# Patient Record
Sex: Male | Born: 1965 | Race: White | Hispanic: No | Marital: Married | Smoking: Former smoker
Health system: Southern US, Community
[De-identification: ages and names within clinical notes are randomized; demographics above are authoritative.]

## PROBLEM LIST (undated history)

## (undated) DIAGNOSIS — R0600 Dyspnea, unspecified: Secondary | ICD-10-CM

## (undated) DIAGNOSIS — R062 Wheezing: Secondary | ICD-10-CM

## (undated) DIAGNOSIS — E785 Hyperlipidemia, unspecified: Secondary | ICD-10-CM

## (undated) DIAGNOSIS — N400 Enlarged prostate without lower urinary tract symptoms: Secondary | ICD-10-CM

## (undated) DIAGNOSIS — Z1211 Encounter for screening for malignant neoplasm of colon: Secondary | ICD-10-CM

## (undated) DIAGNOSIS — I10 Essential (primary) hypertension: Secondary | ICD-10-CM

## (undated) DIAGNOSIS — R12 Heartburn: Secondary | ICD-10-CM

## (undated) DIAGNOSIS — J449 Chronic obstructive pulmonary disease, unspecified: Secondary | ICD-10-CM

## (undated) HISTORY — PX: FRACTURE SURGERY: SHX138

## (undated) HISTORY — PX: APPENDECTOMY: SHX54

## (undated) HISTORY — DX: Encounter for screening for malignant neoplasm of colon: Z12.11

## (undated) HISTORY — PX: HEMORRHOID SURGERY: SHX153

---

## 2008-10-25 ENCOUNTER — Ambulatory Visit: Payer: Self-pay | Admitting: Internal Medicine

## 2011-08-31 DIAGNOSIS — I10 Essential (primary) hypertension: Secondary | ICD-10-CM | POA: Insufficient documentation

## 2012-10-31 ENCOUNTER — Ambulatory Visit: Payer: Self-pay | Admitting: Internal Medicine

## 2012-10-31 LAB — DOT URINE DIP
Blood: NEGATIVE
Protein: NEGATIVE
Specific Gravity: 1.01 (ref 1.003–1.030)

## 2014-09-30 ENCOUNTER — Ambulatory Visit: Payer: Self-pay | Admitting: Internal Medicine

## 2014-10-22 ENCOUNTER — Ambulatory Visit: Payer: Self-pay | Admitting: Physician Assistant

## 2014-10-22 LAB — DOT URINE DIP
Blood: NEGATIVE
GLUCOSE, UR: NEGATIVE
Protein: NEGATIVE
Specific Gravity: 1.02 (ref 1.000–1.030)

## 2015-09-23 ENCOUNTER — Encounter: Payer: Self-pay | Admitting: Internal Medicine

## 2015-09-23 DIAGNOSIS — E785 Hyperlipidemia, unspecified: Secondary | ICD-10-CM | POA: Insufficient documentation

## 2015-09-23 DIAGNOSIS — N400 Enlarged prostate without lower urinary tract symptoms: Secondary | ICD-10-CM | POA: Insufficient documentation

## 2015-09-23 DIAGNOSIS — E782 Mixed hyperlipidemia: Secondary | ICD-10-CM | POA: Insufficient documentation

## 2015-09-23 DIAGNOSIS — Z8249 Family history of ischemic heart disease and other diseases of the circulatory system: Secondary | ICD-10-CM | POA: Insufficient documentation

## 2015-09-23 DIAGNOSIS — F17201 Nicotine dependence, unspecified, in remission: Secondary | ICD-10-CM | POA: Insufficient documentation

## 2015-09-30 ENCOUNTER — Ambulatory Visit (INDEPENDENT_AMBULATORY_CARE_PROVIDER_SITE_OTHER): Payer: BLUE CROSS/BLUE SHIELD | Admitting: Internal Medicine

## 2015-09-30 ENCOUNTER — Encounter: Payer: Self-pay | Admitting: Internal Medicine

## 2015-09-30 VITALS — BP 122/62 | HR 80 | Ht 72.0 in | Wt 202.0 lb

## 2015-09-30 DIAGNOSIS — E785 Hyperlipidemia, unspecified: Secondary | ICD-10-CM

## 2015-09-30 DIAGNOSIS — Z0000 Encounter for general adult medical examination without abnormal findings: Secondary | ICD-10-CM | POA: Diagnosis not present

## 2015-09-30 DIAGNOSIS — F17201 Nicotine dependence, unspecified, in remission: Secondary | ICD-10-CM

## 2015-09-30 DIAGNOSIS — Z23 Encounter for immunization: Secondary | ICD-10-CM

## 2015-09-30 DIAGNOSIS — N400 Enlarged prostate without lower urinary tract symptoms: Secondary | ICD-10-CM | POA: Diagnosis not present

## 2015-09-30 DIAGNOSIS — Z720 Tobacco use: Secondary | ICD-10-CM | POA: Diagnosis not present

## 2015-09-30 NOTE — Progress Notes (Signed)
Date:  09/30/2015   Name:  TAYDON NASWORTHY   DOB:  1966-09-08   MRN:  [id number]   Chief Complaint: Annual Exam JAMAL PAVON is a 49 y.o. male who presents today for his Complete Annual Exam. He feels well. He reports exercising a few times per week. He reports he is sleeping well. 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 He remains off cigarettes and is very happy with his excellent health. He's made dietary changes as well as lost a few pounds. He may be due for a tetanus shot - he does not recall his last one. BPH patient's symptoms are stable. He has mild urinary hesitancy but no other issues. Nocturia 1-2. Tried tamsulosin in the past but it caused erectile dysfunction and lightheadedness. The medication did not provide much benefit in any case.   Review of Systems  Constitutional: Negative for fever, diaphoresis, fatigue and unexpected weight change.  HENT: Negative for congestion, hearing loss, sinus pressure, trouble swallowing and voice change.   Eyes: Positive for visual disturbance (requiring reading glasses).  Respiratory: Negative for cough, chest tightness and shortness of breath.   Cardiovascular: Negative for chest pain, palpitations and leg swelling.  Gastrointestinal: Negative for abdominal pain, constipation and blood in stool.  Endocrine: Negative for polydipsia and polyuria.  Genitourinary: Positive for difficulty urinating. Negative for urgency, frequency and hematuria.  Musculoskeletal: Positive for joint swelling (dupytren's contractures bilaterally). Negative for back pain, arthralgias and gait problem.  Skin: Negative for color change and rash.  Allergic/Immunologic: Negative for environmental allergies.  Neurological: Negative for light-headedness, numbness and headaches.  Hematological: Negative for adenopathy.  Psychiatric/Behavioral: Negative for sleep disturbance and dysphoric mood.    Patient Active Problem List   Diagnosis Date Noted  .  BPH (benign prostatic hyperplasia) 09/23/2015  . 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 Hyperlipidemia 09/23/2015  . Tobacco use disorder, mild, in sustained remission 09/23/2015  . BP (high blood pressure) 08/31/2011    Prior to Admission medications   Not on File    No Known Allergies  Past Surgical History  Procedure Laterality Date  . Appendectomy    . Hemorrhoid surgery      Social History  Substance Use Topics  . Smoking status: Former Research scientist (life sciences)  . Smokeless tobacco: None  . Alcohol Use: No     Medication list has been reviewed and updated.   Physical Exam  Constitutional: He is oriented to person, place, and time. He appears well-developed and well-nourished. No distress.  HENT:  Head: Normocephalic and atraumatic.  Eyes: Conjunctivae are normal. Pupils are equal, round, and reactive to light. Right eye exhibits no discharge. Left eye exhibits no discharge. No scleral icterus.  Neck: Normal range of motion. Neck supple. No thyromegaly present.  Cardiovascular: Normal rate, regular rhythm and normal heart sounds.   Pulmonary/Chest: Effort normal and breath sounds normal. No respiratory distress. He has no wheezes. Right breast exhibits no mass. Left breast exhibits no mass.  Abdominal: Soft. Bowel sounds are normal. There is no tenderness. There is no rebound, no guarding and no CVA tenderness.  Musculoskeletal: Normal range of motion. He exhibits no edema or tenderness.       Right hip: Normal.       Left hip: Normal.       Right knee: Normal.       Left knee: Normal.  Neurological: He is alert and oriented to person, place, and time. He has normal reflexes.  Skin: Skin  is warm and dry. No rash noted.  Psychiatric: He has a normal mood and affect. His speech is normal and behavior is normal. Thought content normal.  Nursing note and vitals reviewed.   BP 122/62 mmHg  Pulse 80  Ht 6' (1.829 m)  Wt 202 lb (91.627 kg)  BMI 27.39 kg/m2  Assessment and Plan: 1.  Annual physical exam - POCT urinalysis dipstick - CBC with Differential/Platelet - Comprehensive metabolic panel - TSH  2. Tobacco use disorder, mild, in sustained remission  3. BPH (benign prostatic hyperplasia) Stable mild symptoms; remain off medication for now - PSA  4. Hyperlipidemia Check labs and advise - Lipid panel  5. Need for diphtheria-tetanus-pertussis (Tdap) vaccine - Tdap vaccine greater than or equal to 7yo IM  6. Flu vaccine need - Flu Vaccine QUAD 36+ mos PF IM (Fluarix & Fluzone Quad PF)   Halina Maidens, MD [facility]  09/30/2015

## 2015-09-30 NOTE — Patient Instructions (Signed)
Tdap Vaccine (Tetanus, Diphtheria and Pertussis): What You Need to Know 1. Why get vaccinated? Tetanus, diphtheria and pertussis are very serious diseases. Tdap vaccine can protect us from these diseases. And, Tdap vaccine given to pregnant women can protect newborn babies against pertussis. TETANUS (Lockjaw) is rare in the [place] today. It causes painful muscle tightening and stiffness, usually all over the body.  It can lead to tightening of muscles in the head and neck so you can't open your mouth, swallow, or sometimes even breathe. Tetanus kills about 1 out of 10 people who are infected even after receiving the best medical care. DIPHTHERIA is also rare in the [place] today. It can cause a thick coating to form in the back of the throat.  It can lead to breathing problems, heart failure, paralysis, and death. PERTUSSIS (Whooping Cough) causes severe coughing spells, which can cause difficulty breathing, vomiting and disturbed sleep.  It can also lead to weight loss, incontinence, and rib fractures. Up to 2 in 100 adolescents and 5 in 100 adults with pertussis are hospitalized or have complications, which could include pneumonia or death. These diseases are caused by bacteria. Diphtheria and pertussis are spread from person to person through secretions from coughing or sneezing. Tetanus enters the body through cuts, scratches, or wounds. Before vaccines, as many as 200,000 cases of diphtheria, 200,000 cases of pertussis, and hundreds of cases of tetanus, were reported in the [place] each year. Since vaccination began, reports of cases for tetanus and diphtheria have dropped by about 99% and for pertussis by about 80%. 2. Tdap vaccine Tdap vaccine can protect adolescents and adults from tetanus, diphtheria, and pertussis. One dose of Tdap is routinely given at age 11 or 12. People who did not get Tdap at that age should get it as soon as possible. Tdap is especially important  for healthcare professionals and anyone having close contact with a baby younger than 12 months. Pregnant women should get a dose of Tdap during every pregnancy, to protect the newborn from pertussis. Infants are most at risk for severe, life-threatening complications from pertussis. Another vaccine, called Td, protects against tetanus and diphtheria, but not pertussis. A Td booster should be given every 10 years. Tdap may be given as one of these boosters if you have never gotten Tdap before. Tdap may also be given after a severe cut or burn to prevent tetanus infection. Your doctor or the person giving you the vaccine can give you more information. Tdap may safely be given at the same time as other vaccines. 3. Some people should not get this vaccine  A person who has ever had a life-threatening allergic reaction after a previous dose of any diphtheria, tetanus or pertussis containing vaccine, OR has a severe allergy to any part of this vaccine, should not get Tdap vaccine. Tell the person giving the vaccine about any severe allergies.  Anyone who had coma or long repeated seizures within 7 days after a childhood dose of DTP or DTaP, or a previous dose of Tdap, should not get Tdap, unless a cause other than the vaccine was found. They can still get Td.  Talk to your doctor if you:  have seizures or another nervous system problem,  had severe pain or swelling after any vaccine containing diphtheria, tetanus or pertussis,  ever had a condition called Guillain-Barr Syndrome (GBS),  aren't feeling well on the day the shot is scheduled. 4. Risks With any medicine, including vaccines, there is   a chance of side effects. These are usually mild and go away on their own. Serious reactions are also possible but are rare. Most people who get Tdap vaccine do not have any problems with it. Mild problems following Tdap (Did not interfere with activities)  Pain where the shot was given (about 3 in 4  adolescents or 2 in 3 adults)  Redness or swelling where the shot was given (about 1 person in 5)  Mild fever of at least 100.4F (up to about 1 in 25 adolescents or 1 in 100 adults)  Headache (about 3 or 4 people in 10)  Tiredness (about 1 person in 3 or 4)  Nausea, vomiting, diarrhea, stomach ache (up to 1 in 4 adolescents or 1 in 10 adults)  Chills, sore joints (about 1 person in 10)  Body aches (about 1 person in 3 or 4)  Rash, swollen glands (uncommon) Moderate problems following Tdap (Interfered with activities, but did not require medical attention)  Pain where the shot was given (up to 1 in 5 or 6)  Redness or swelling where the shot was given (up to about 1 in 16 adolescents or 1 in 12 adults)  Fever over 102F (about 1 in 100 adolescents or 1 in 250 adults)  Headache (about 1 in 7 adolescents or 1 in 10 adults)  Nausea, vomiting, diarrhea, stomach ache (up to 1 or 3 people in 100)  Swelling of the entire arm where the shot was given (up to about 1 in 500). Severe problems following Tdap (Unable to perform usual activities; required medical attention)  Swelling, severe pain, bleeding and redness in the arm where the shot was given (rare). Problems that could happen after any vaccine:  People sometimes faint after a medical procedure, including vaccination. Sitting or lying down for about 15 minutes can help prevent fainting, and injuries caused by a fall. Tell your doctor if you feel dizzy, or have vision changes or ringing in the ears.  Some people get severe pain in the shoulder and have difficulty moving the arm where a shot was given. This happens very rarely.  Any medication can cause a severe allergic reaction. Such reactions from a vaccine are very rare, estimated at fewer than 1 in a million doses, and would happen within a few minutes to a few hours after the vaccination. As with any medicine, there is a very remote chance of a vaccine causing a serious  injury or death. The safety of vaccines is always being monitored. For more information, visit: www.cdc.gov/vaccinesafety/ 5. What if there is a serious problem? What should I look for?  Look for anything that concerns you, such as signs of a severe allergic reaction, very high fever, or unusual behavior.  Signs of a severe allergic reaction can include hives, swelling of the face and throat, difficulty breathing, a fast heartbeat, dizziness, and weakness. These would usually start a few minutes to a few hours after the vaccination. What should I do?  If you think it is a severe allergic reaction or other emergency that can't wait, call 9-1-1 or get the person to the nearest hospital. Otherwise, call your doctor.  Afterward, the reaction should be reported to the Vaccine Adverse Event Reporting System (VAERS). Your doctor might file this report, or you can do it yourself through the VAERS web site at www.vaers.hhs.gov, or by calling 1-800-822-7967. VAERS does not give medical advice.  6. The National Vaccine Injury Compensation Program The National Vaccine Injury Compensation Program (  VICP) is a federal program that was created to compensate people who may have been injured by certain vaccines. Persons who believe they may have been injured by a vaccine can learn about the program and about filing a claim by calling 1-800-338-2382 or visiting the VICP website at www.hrsa.gov/vaccinecompensation. There is a time limit to file a claim for compensation. 7. How can I learn more?  Ask your doctor. He or she can give you the vaccine package insert or suggest other sources of information.  Call your local or state health department.  Contact the Centers for Disease Control and Prevention (CDC):  Call 1-800-232-4636 (1-800-CDC-INFO) or  Visit CDC's website at www.cdc.gov/vaccines CDC Tdap Vaccine VIS (02/09/14)   This information is not intended to replace advice given to you by your health care  Lawrence Vega. Make sure you discuss any questions you have with your health care Lawrence Vega.   Document Released: 06/03/2012 Document Revised: 12/24/2014 Document Reviewed: 03/17/2014 Elsevier Interactive Patient Education 2016 Elsevier Inc.  

## 2015-10-01 LAB — CBC WITH DIFFERENTIAL/PLATELET
BASOS: 0 %
Basophils Absolute: 0 10*3/uL (ref 0.0–0.2)
EOS (ABSOLUTE): 0.2 10*3/uL (ref 0.0–0.4)
EOS: 2 %
HEMATOCRIT: 45.1 % (ref 37.5–51.0)
Hemoglobin: 15.2 g/dL (ref 12.6–17.7)
Immature Grans (Abs): 0 10*3/uL (ref 0.0–0.1)
Immature Granulocytes: 0 %
LYMPHS ABS: 2.2 10*3/uL (ref 0.7–3.1)
Lymphs: 29 %
MCH: 28.1 pg (ref 26.6–33.0)
MCHC: 33.7 g/dL (ref 31.5–35.7)
MCV: 83 fL (ref 79–97)
Monocytes Absolute: 0.4 10*3/uL (ref 0.1–0.9)
Monocytes: 5 %
Neutrophils Absolute: 4.7 10*3/uL (ref 1.4–7.0)
Neutrophils: 64 %
Platelets: 206 10*3/uL (ref 150–379)
RBC: 5.41 x10E6/uL (ref 4.14–5.80)
RDW: 13.4 % (ref 12.3–15.4)
WBC: 7.5 10*3/uL (ref 3.4–10.8)

## 2015-10-01 LAB — COMPREHENSIVE METABOLIC PANEL
A/G RATIO: 1.8 (ref 1.1–2.5)
ALK PHOS: 95 IU/L (ref 39–117)
ALT: 28 IU/L (ref 0–44)
AST: 23 IU/L (ref 0–40)
Albumin: 4.7 g/dL (ref 3.5–5.5)
BILIRUBIN TOTAL: 0.7 mg/dL (ref 0.0–1.2)
BUN/Creatinine Ratio: 11 (ref 9–20)
BUN: 12 mg/dL (ref 6–24)
CO2: 24 mmol/L (ref 18–29)
Calcium: 9.7 mg/dL (ref 8.7–10.2)
Chloride: 96 mmol/L — ABNORMAL LOW (ref 97–108)
Creatinine, Ser: 1.08 mg/dL (ref 0.76–1.27)
GFR calc Af Amer: 94 mL/min/{1.73_m2} (ref >59)
GFR calc non Af Amer: 81 mL/min/{1.73_m2} (ref >59)
GLOBULIN, TOTAL: 2.6 g/dL (ref 1.5–4.5)
Glucose: 89 mg/dL (ref 65–99)
POTASSIUM: 4.1 mmol/L (ref 3.5–5.2)
SODIUM: 137 mmol/L (ref 134–144)
Total Protein: 7.3 g/dL (ref 6.0–8.5)

## 2015-10-01 LAB — TSH: TSH: 0.559 u[IU]/mL (ref 0.450–4.500)

## 2015-10-01 LAB — PSA: Prostate Specific Ag, Serum: 0.5 ng/mL (ref 0.0–4.0)

## 2015-10-01 LAB — LIPID PANEL
CHOLESTEROL TOTAL: 250 mg/dL — AB (ref 100–199)
Chol/HDL Ratio: 5.2 ratio units — ABNORMAL HIGH (ref 0.0–5.0)
HDL: 48 mg/dL (ref >39)
LDL Calculated: 171 mg/dL — ABNORMAL HIGH (ref 0–99)
Triglycerides: 156 mg/dL — ABNORMAL HIGH (ref 0–149)
VLDL Cholesterol Cal: 31 mg/dL (ref 5–40)

## 2016-08-31 ENCOUNTER — Ambulatory Visit (INDEPENDENT_AMBULATORY_CARE_PROVIDER_SITE_OTHER): Payer: BLUE CROSS/BLUE SHIELD | Admitting: Internal Medicine

## 2016-08-31 ENCOUNTER — Encounter: Payer: Self-pay | Admitting: Internal Medicine

## 2016-08-31 VITALS — BP 136/84 | HR 64 | Resp 16 | Ht 72.0 in | Wt 199.0 lb

## 2016-08-31 DIAGNOSIS — L729 Follicular cyst of the skin and subcutaneous tissue, unspecified: Secondary | ICD-10-CM | POA: Diagnosis not present

## 2016-08-31 DIAGNOSIS — IMO0001 Reserved for inherently not codable concepts without codable children: Secondary | ICD-10-CM

## 2016-08-31 DIAGNOSIS — E785 Hyperlipidemia, unspecified: Secondary | ICD-10-CM | POA: Diagnosis not present

## 2016-08-31 DIAGNOSIS — R030 Elevated blood-pressure reading, without diagnosis of hypertension: Secondary | ICD-10-CM | POA: Diagnosis not present

## 2016-08-31 DIAGNOSIS — J40 Bronchitis, not specified as acute or chronic: Secondary | ICD-10-CM

## 2016-08-31 DIAGNOSIS — M67449 Ganglion, unspecified hand: Secondary | ICD-10-CM

## 2016-08-31 DIAGNOSIS — N400 Enlarged prostate without lower urinary tract symptoms: Secondary | ICD-10-CM

## 2016-08-31 DIAGNOSIS — Z0000 Encounter for general adult medical examination without abnormal findings: Secondary | ICD-10-CM

## 2016-08-31 LAB — POCT URINALYSIS DIPSTICK
Bilirubin, UA: NEGATIVE
GLUCOSE UA: NEGATIVE
Ketones, UA: NEGATIVE
LEUKOCYTES UA: NEGATIVE
NITRITE UA: NEGATIVE
PROTEIN UA: NEGATIVE
RBC UA: NEGATIVE
Spec Grav, UA: 1.01
UROBILINOGEN UA: 0.2
pH, UA: 6

## 2016-08-31 MED ORDER — AZITHROMYCIN 250 MG PO TABS: ORAL_TABLET | ORAL | 0 refills | Status: DC

## 2016-08-31 MED ORDER — HYDROCOD POLST-CPM POLST ER 10-8 MG/5ML PO SUER: 5.0000 mL | Freq: Two times a day (BID) | ORAL | 0 refills | Status: DC

## 2016-08-31 NOTE — Progress Notes (Signed)
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 ) Lawrence Vega is a 50 y.o. male who presents today for his Complete Annual Exam. He feels well. He reports exercising walking and has a physical job. He reports he is sleeping well in general - worse recently due to cough. He has a biometric form to be completed for work.  URI   This is a new problem. The current episode started in the past 7 days. There has been no fever. Associated symptoms include coughing (thick green phlegm) and a sore throat. Pertinent negatives include no abdominal pain, chest pain, diarrhea, dysuria, ear pain, headaches, rash or wheezing. He has tried nothing for the symptoms.     Review of Systems  Constitutional: Negative for appetite change, chills, diaphoresis, fatigue and unexpected weight change.  HENT: Positive for sore throat. Negative for ear pain, hearing loss, tinnitus, trouble swallowing and voice change.   Eyes: Negative for visual disturbance.  Respiratory: Positive for cough (thick green phlegm). Negative for choking, shortness of breath and wheezing.   Cardiovascular: Negative for chest pain, palpitations and leg swelling.  Gastrointestinal: Negative for abdominal pain, blood in stool, constipation and diarrhea.  Genitourinary: Negative for difficulty urinating, dysuria and frequency.  Musculoskeletal: Positive for arthralgias and back pain. Negative for myalgias.  Skin: Negative for color change and rash.  Neurological: Negative for dizziness, syncope and headaches.  Hematological: Negative for adenopathy.  Psychiatric/Behavioral: Negative for dysphoric mood and sleep disturbance.    Patient Active Problem List   Diagnosis Date Noted  . BPH (benign prostatic hyperplasia) 09/23/2015  . 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  Hyperlipidemia 09/23/2015  . Tobacco use disorder, mild, in sustained remission 09/23/2015  . BP (high blood pressure) 08/31/2011    Prior to Admission medications   Not on File    No Known Allergies  Past Surgical History:  Procedure Laterality Date  . APPENDECTOMY    . HEMORRHOID SURGERY      Social History  Substance Use Topics  . Smoking status: Former Research scientist (life sciences)  . Smokeless tobacco: Never Used  . Alcohol use No     Medication list has been reviewed and updated.   Physical Exam  Constitutional: He is oriented to person, place, and time. He appears well-developed and well-nourished.  HENT:  Head: Normocephalic.  Right Ear: Tympanic membrane, external ear and ear canal normal.  Left Ear: Tympanic membrane, external ear and ear canal normal.  Nose: Nose normal.  Mouth/Throat: Uvula is midline and oropharynx is clear and moist.  Eyes: Conjunctivae and EOM are normal. Pupils are equal, round, and reactive to light.  Neck: Normal range of motion. Neck supple. Carotid bruit is not present. No thyromegaly present.  Cardiovascular: Normal rate, regular rhythm, normal heart sounds and intact distal pulses.   Pulmonary/Chest: Effort normal and breath sounds normal. He has no wheezes. Right breast exhibits no mass. Left breast exhibits no mass.  Abdominal: Soft. Normal appearance and bowel sounds are normal. There is no hepatosplenomegaly. There is no tenderness.  Genitourinary: Rectum normal and prostate normal. Rectal exam shows no mass and no tenderness.  Musculoskeletal: Normal range of motion.  Mucous cyst of left 5th finger  Lymphadenopathy:    He has no cervical adenopathy.  Neurological: He is alert and oriented  to person, place, and time. He has normal reflexes.  Skin: Skin is warm, dry and intact.  Psychiatric: He has a normal mood and affect. His speech is normal and behavior is normal. Judgment and thought content normal.  Nursing note and vitals reviewed.   BP  136/84 (BP Location: Right Arm, Patient Position: Sitting, Cuff Size: Normal)   Pulse 64   Resp 16   Ht 6' (1.829 m)   Wt 199 lb (90.3 kg)   SpO2 97%   BMI 26.99 kg/m   Assessment and Plan: 1. Annual physical exam Normal exam - POCT urinalysis dipstick  2. Elevated blood pressure Stable - no treatment needed Continue low salt diet and exercise - CBC with Differential/Platelet - Comprehensive metabolic panel  3. BPH (benign prostatic hyperplasia) - PSA  4. Hyperlipidemia - Lipid panel  5. Bronchitis - azithromycin (ZITHROMAX Z-PAK) 250 MG tablet; Take 2 tabs on day #1 then 1 tab daily  Dispense: 6 each; Refill: 0 - chlorpheniramine-HYDROcodone (TUSSIONEX PENNKINETIC ER) 10-8 MG/5ML SUER; Take 5 mLs by mouth 2 (two) times daily.  Dispense: 473 mL; Refill: 0  6. Mucous cyst of finger Refer to Orthopedics Can take tylenol or Advil as needed for joint pain   Halina Maidens, MD [facility]  08/31/2016

## 2016-09-01 LAB — CBC WITH DIFFERENTIAL/PLATELET
BASOS: 0 %
Basophils Absolute: 0 10*3/uL (ref 0.0–0.2)
EOS (ABSOLUTE): 0.2 10*3/uL (ref 0.0–0.4)
EOS: 2 %
HEMOGLOBIN: 16 g/dL (ref 12.6–17.7)
Hematocrit: 46 % (ref 37.5–51.0)
IMMATURE GRANS (ABS): 0 10*3/uL (ref 0.0–0.1)
IMMATURE GRANULOCYTES: 0 %
LYMPHS: 18 %
Lymphocytes Absolute: 1.7 10*3/uL (ref 0.7–3.1)
MCH: 29.4 pg (ref 26.6–33.0)
MCHC: 34.8 g/dL (ref 31.5–35.7)
MCV: 84 fL (ref 79–97)
MONOCYTES: 8 %
Monocytes Absolute: 0.8 10*3/uL (ref 0.1–0.9)
NEUTROS ABS: 6.7 10*3/uL (ref 1.4–7.0)
NEUTROS PCT: 72 %
Platelets: 206 10*3/uL (ref 150–379)
RBC: 5.45 x10E6/uL (ref 4.14–5.80)
RDW: 14.1 % (ref 12.3–15.4)
WBC: 9.3 10*3/uL (ref 3.4–10.8)

## 2016-09-01 LAB — COMPREHENSIVE METABOLIC PANEL
A/G RATIO: 1.6 (ref 1.2–2.2)
ALBUMIN: 4.5 g/dL (ref 3.5–5.5)
ALT: 18 IU/L (ref 0–44)
AST: 18 IU/L (ref 0–40)
Alkaline Phosphatase: 92 IU/L (ref 39–117)
BILIRUBIN TOTAL: 0.9 mg/dL (ref 0.0–1.2)
BUN / CREAT RATIO: 11 (ref 9–20)
BUN: 11 mg/dL (ref 6–24)
CALCIUM: 9.3 mg/dL (ref 8.7–10.2)
CHLORIDE: 100 mmol/L (ref 96–106)
CO2: 22 mmol/L (ref 18–29)
Creatinine, Ser: 0.98 mg/dL (ref 0.76–1.27)
GFR, EST AFRICAN AMERICAN: 105 mL/min/{1.73_m2} (ref >59)
GFR, EST NON AFRICAN AMERICAN: 91 mL/min/{1.73_m2} (ref >59)
Globulin, Total: 2.8 g/dL (ref 1.5–4.5)
Glucose: 77 mg/dL (ref 65–99)
POTASSIUM: 4.2 mmol/L (ref 3.5–5.2)
Sodium: 140 mmol/L (ref 134–144)
TOTAL PROTEIN: 7.3 g/dL (ref 6.0–8.5)

## 2016-09-01 LAB — LIPID PANEL
CHOL/HDL RATIO: 4.9 ratio (ref 0.0–5.0)
Cholesterol, Total: 221 mg/dL — ABNORMAL HIGH (ref 100–199)
HDL: 45 mg/dL (ref >39)
LDL Calculated: 143 mg/dL — ABNORMAL HIGH (ref 0–99)
Triglycerides: 165 mg/dL — ABNORMAL HIGH (ref 0–149)
VLDL CHOLESTEROL CAL: 33 mg/dL (ref 5–40)

## 2016-09-01 LAB — PSA: PROSTATE SPECIFIC AG, SERUM: 0.4 ng/mL (ref 0.0–4.0)

## 2016-09-24 ENCOUNTER — Telehealth: Payer: Self-pay

## 2016-09-24 ENCOUNTER — Other Ambulatory Visit: Payer: Self-pay | Admitting: Internal Medicine

## 2016-09-24 DIAGNOSIS — J40 Bronchitis, not specified as acute or chronic: Secondary | ICD-10-CM

## 2016-09-24 MED ORDER — AZITHROMYCIN 250 MG PO TABS: ORAL_TABLET | ORAL | 0 refills | Status: DC

## 2016-09-24 NOTE — Telephone Encounter (Signed)
Left VM requesting ZPak again. Was seen for CPE 4 weeks ago and having more [facility] and wants to do one more round of Abx. Please let me know what to tell him if he needs OV. 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

## 2016-09-24 NOTE — Telephone Encounter (Signed)
Sent to [employer].  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

## 2016-09-28 ENCOUNTER — Ambulatory Visit
Admission: EM | Admit: 2016-09-28 | Discharge: 2016-09-28 | Disposition: A | Discharge status: Home / Self Care | Payer: Self-pay | Attending: Family Medicine | Admitting: Family Medicine

## 2016-09-28 ENCOUNTER — Encounter: Payer: Self-pay | Admitting: *Deleted

## 2016-09-28 DIAGNOSIS — Z0289 Encounter for other administrative examinations: Secondary | ICD-10-CM

## 2016-09-28 LAB — DEPT OF TRANSP DIPSTICK, URINE (ARMC ONLY)
Glucose, UA: NEGATIVE mg/dL
HGB URINE DIPSTICK: NEGATIVE
PROTEIN: NEGATIVE mg/dL
Specific Gravity, Urine: 1.02 (ref 1.005–1.030)

## 2016-09-28 NOTE — ED Provider Notes (Signed)
[facility]    CSN: [id number] Arrival date & time: 09/28/16  0834     History   Chief Complaint Chief Complaint  Patient presents with  . Commercial Driver's License Exam    HPI Lawrence Vega is a 50 y.o. male.   Patient here for DOT Physical (see scanned form)   The history is provided by the patient.    History reviewed. No pertinent past medical history.  Patient Active Problem List   Diagnosis Date Noted  . BPH (benign prostatic hyperplasia) 09/23/2015  . 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 Hyperlipidemia 09/23/2015  . Tobacco use disorder, mild, in sustained remission 09/23/2015  . BP (high blood pressure) 08/31/2011    Past Surgical History:  Procedure Laterality Date  . APPENDECTOMY    . FRACTURE SURGERY    . HEMORRHOID SURGERY         Home Medications    Prior to Admission medications   Medication Sig Start Date End Date Taking? Authorizing Provider  azithromycin (ZITHROMAX Z-PAK) 250 MG tablet Take 2 tabs on day #1 then 1 tab daily 09/24/16  Yes Glean Hess, MD  chlorpheniramine-HYDROcodone [facility] PENNKINETIC ER) 10-8 MG/5ML SUER Take 5 mLs by mouth 2 (two) times daily. 08/31/16   Glean Hess, MD    Family History Family History  Problem Relation Age of Onset  . Hypertension Father   . CAD Father   . AAA (abdominal aortic aneurysm) Father     Social History Social History  Substance Use Topics  . Smoking status: Former Research scientist (life sciences)  . Smokeless tobacco: Never Used  . Alcohol use No     Allergies   Review of patient's allergies indicates no known allergies.   Review of Systems Review of Systems   Physical Exam Triage Vital Signs ED Triage Vitals  Enc Vitals Group     BP --      Pulse Rate 09/28/16 0901 65     Resp 09/28/16 0901 18     Temp 09/28/16 0901 98.2 F (36.8 C)     Temp Source 09/28/16 0901 Oral     SpO2 09/28/16 0901 99 %     Weight 09/28/16 0904 203 lb (92.1 kg)     Height 09/28/16  0904 6' (1.829 m)     Head Circumference --      Peak Flow --      Pain Score --      Pain Loc --      Pain Edu? --      Excl. in [place]? --    No data found.   Updated Vital Signs Pulse 65   Temp 98.2 F (36.8 C) (Oral)   Resp 18   Ht 6' (1.829 m)   Wt 203 lb (92.1 kg)   SpO2 99%   BMI 27.53 kg/m   Visual Acuity Right Eye Distance:   Left Eye Distance:   Bilateral Distance:    Right Eye Near:   Left Eye Near:    Bilateral Near:     Physical Exam   UC Treatments / Results  Labs (all labs ordered are listed, but only abnormal results are displayed) Labs Reviewed  DEPT OF TRANSP DIPSTICK, URINE([facility] ONLY)    EKG  EKG Interpretation None       Radiology No results found.  Procedures Procedures (including critical care time)  Medications Ordered in UC Medications - No data to display   Initial Impression / Assessment and Plan / UC  Course  I have reviewed the triage vital signs and the nursing notes.  Pertinent labs & imaging results that were available during my care of the patient were reviewed by me and considered in my medical decision making (see chart for details).  Clinical Course      Final Clinical Impressions(s) / UC Diagnoses   Final diagnoses:  Encounter for examination required by Department of Transportation (DOT)    New Prescriptions New Prescriptions   No medications on file   DOT Physical (medically qualified for 2 years; see scanned form)   Norval Gable, MD 09/28/16 [phone]

## 2016-09-28 NOTE — ED Triage Notes (Signed)
Commercial Drivers License physical exam

## 2016-10-05 ENCOUNTER — Encounter: Payer: Self-pay | Admitting: Internal Medicine

## 2017-09-06 ENCOUNTER — Ambulatory Visit
Admission: RE | Admit: 2017-09-06 | Discharge: 2017-09-06 | Disposition: A | Discharge status: Home / Self Care | Payer: BLUE CROSS/BLUE SHIELD | Source: Ambulatory Visit | Attending: Internal Medicine | Admitting: Internal Medicine

## 2017-09-06 ENCOUNTER — Other Ambulatory Visit: Admission: RE | Admit: 2017-09-06 | Payer: BLUE CROSS/BLUE SHIELD | Source: Ambulatory Visit | Admitting: *Deleted

## 2017-09-06 ENCOUNTER — Encounter: Payer: Self-pay | Admitting: Internal Medicine

## 2017-09-06 ENCOUNTER — Ambulatory Visit (INDEPENDENT_AMBULATORY_CARE_PROVIDER_SITE_OTHER): Payer: BLUE CROSS/BLUE SHIELD | Admitting: Internal Medicine

## 2017-09-06 VITALS — BP 142/96 | HR 60 | Ht 72.0 in | Wt 209.0 lb

## 2017-09-06 DIAGNOSIS — Z125 Encounter for screening for malignant neoplasm of prostate: Secondary | ICD-10-CM | POA: Diagnosis not present

## 2017-09-06 DIAGNOSIS — R062 Wheezing: Secondary | ICD-10-CM | POA: Diagnosis not present

## 2017-09-06 DIAGNOSIS — Z0001 Encounter for general adult medical examination with abnormal findings: Secondary | ICD-10-CM

## 2017-09-06 DIAGNOSIS — E782 Mixed hyperlipidemia: Secondary | ICD-10-CM | POA: Diagnosis not present

## 2017-09-06 DIAGNOSIS — I10 Essential (primary) hypertension: Secondary | ICD-10-CM

## 2017-09-06 DIAGNOSIS — Z8249 Family history of ischemic heart disease and other diseases of the circulatory system: Secondary | ICD-10-CM

## 2017-09-06 DIAGNOSIS — N400 Enlarged prostate without lower urinary tract symptoms: Secondary | ICD-10-CM | POA: Diagnosis not present

## 2017-09-06 DIAGNOSIS — F17201 Nicotine dependence, unspecified, in remission: Secondary | ICD-10-CM

## 2017-09-06 DIAGNOSIS — Z0000 Encounter for general adult medical examination without abnormal findings: Secondary | ICD-10-CM

## 2017-09-06 LAB — POCT URINALYSIS DIPSTICK
Bilirubin, UA: NEGATIVE
GLUCOSE UA: NEGATIVE
Ketones, UA: NEGATIVE
LEUKOCYTES UA: NEGATIVE
NITRITE UA: NEGATIVE
Protein, UA: NEGATIVE
RBC UA: NEGATIVE
Spec Grav, UA: 1.015 (ref 1.010–1.025)
UROBILINOGEN UA: 0.2 U/dL
pH, UA: 6 (ref 5.0–8.0)

## 2017-09-06 MED ORDER — ALBUTEROL SULFATE HFA 108 (90 BASE) MCG/ACT IN AERS: 2.0000 | INHALATION_SPRAY | Freq: Four times a day (QID) | RESPIRATORY_TRACT | 0 refills | Status: DC | PRN

## 2017-09-06 MED ORDER — LOSARTAN POTASSIUM 50 MG PO TABS: 50.0000 mg | ORAL_TABLET | Freq: Every day | ORAL | 5 refills | Status: DC

## 2017-09-06 NOTE — Progress Notes (Signed)
Date:  09/06/2017   Name:  Lawrence Vega   DOB:  November 08, 1966   MRN:  [id number]   Chief Complaint: Annual Exam Lawrence Vega is a 51 y.o. male who presents today for his Complete Annual Exam. He feels well. 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 He reports he is sleeping well.   Also needs biometrics for work and insurance.  Wheezing   This is a new problem. The problem occurs intermittently. Pertinent negatives include no abdominal pain, chest pain, chills, diarrhea, headaches, rash or shortness of breath.  Hypertension  The problem has been waxing and waning since onset. The problem is uncontrolled. Pertinent negatives include no chest pain, headaches, palpitations or shortness of breath. There are no associated agents to hypertension. Risk factors for coronary artery disease include family history. Past treatments include nothing.     Review of Systems  Constitutional: Negative for appetite change, chills, diaphoresis, fatigue and unexpected weight change.  HENT: Positive for sneezing. Negative for hearing loss, tinnitus, trouble swallowing and voice change.   Eyes: Negative for visual disturbance.  Respiratory: Positive for wheezing. Negative for choking, chest tightness and shortness of breath.   Cardiovascular: Negative for chest pain, palpitations and leg swelling.  Gastrointestinal: Negative for abdominal pain, blood in stool, constipation and diarrhea.  Genitourinary: Negative for difficulty urinating, dysuria and frequency.  Musculoskeletal: Negative for arthralgias, back pain and myalgias.  Skin: Negative for color change and rash.  Allergic/Immunologic: Positive for environmental allergies.  Neurological: Negative for dizziness, syncope and headaches.  Hematological: Negative for adenopathy.  Psychiatric/Behavioral: Negative for dysphoric mood and sleep disturbance.    Patient Active Problem List   Diagnosis Date Noted   . Benign prostatic hyperplasia 09/23/2015  . 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 Hyperlipidemia 09/23/2015  . Tobacco use disorder, mild, in sustained remission 09/23/2015  . BP (high blood pressure) 08/31/2011    Prior to Admission medications   Not on File    No Known Allergies  Past Surgical History:  Procedure Laterality Date  . APPENDECTOMY    . FRACTURE SURGERY    . HEMORRHOID SURGERY      Social History  Substance Use Topics  . Smoking status: Former Smoker    Packs/day: 2.00    Years: 25.00    Types: Cigarettes    Quit date: 01/18/2011  . Smokeless tobacco: Never Used  . Alcohol use No     Medication list has been reviewed and updated.  PHQ 2/9 Scores 09/06/2017  PHQ - 2 Score 0    Physical Exam  Constitutional: He is oriented to person, place, and time. He appears well-developed and well-nourished.  HENT:  Head: Normocephalic.  Right Ear: Tympanic membrane, external ear and ear canal normal.  Left Ear: Tympanic membrane, external ear and ear canal normal.  Nose: Nose normal.  Mouth/Throat: Uvula is midline and oropharynx is clear and moist.  Eyes: Pupils are equal, round, and reactive to light. Conjunctivae and EOM are normal.  Neck: Normal range of motion. Neck supple. Carotid bruit is not present. No thyromegaly present.  Cardiovascular: Normal rate, regular rhythm, normal heart sounds and intact distal pulses.   Pulmonary/Chest: Effort normal and breath sounds normal. He has no wheezes. Right breast exhibits no mass. Left breast exhibits no mass.  Abdominal: Soft. Normal appearance and bowel sounds are normal. There is no hepatosplenomegaly. There is no tenderness.  Musculoskeletal: Normal range of motion. He exhibits no edema  or tenderness.  Lymphadenopathy:    He has no cervical adenopathy.  Neurological: He is alert and oriented to person, place, and time. He has normal reflexes.  Skin: Skin is warm, dry and intact.  Psychiatric: He has a  normal mood and affect. His speech is normal and behavior is normal. Judgment and thought content normal.  Nursing note and vitals reviewed.   BP (!) 142/96   Pulse 60   Ht 6' (1.829 m)   Wt 209 lb (94.8 kg)   SpO2 98%   BMI 28.35 kg/m   Assessment and Plan: 1. Annual physical exam Exam completed; form will be completed - POCT urinalysis dipstick  2. Essential hypertension Begin low dose ARB - losartan (COZAAR) 50 MG tablet; Take 1 tablet (50 mg total) by mouth daily.  Dispense: 30 tablet; Refill: 5  3. Mixed hyperlipidemia Continue to work on healthy diet and weight loss - Comprehensive metabolic panel - Lipid panel  4. Familial aortic aneurysm  5. Benign prostatic hyperplasia, unspecified whether lower urinary tract symptoms present Sx stable on no medications  6. Tobacco use disorder, mild, in sustained remission Consider low dose screening chest CT  7. Wheezing Rule out early COPD Consider allergies as contributing - begin claritin 10 mg daily - DG Chest 2 View - CBC with Differential/Platelet - albuterol (PROVENTIL HFA;VENTOLIN HFA) 108 (90 Base) MCG/ACT inhaler; Inhale 2 puffs into the lungs every 6 (six) hours as needed for wheezing or shortness of breath.  Dispense: 1 Inhaler; Refill: 0  8. Prostate cancer screening - PSA   Meds ordered this encounter  Medications  . albuterol (PROVENTIL HFA;VENTOLIN HFA) 108 (90 Base) MCG/ACT inhaler    Sig: Inhale 2 puffs into the lungs every 6 (six) hours as needed for wheezing or shortness of breath.    Dispense:  1 Inhaler    Refill:  0  . losartan (COZAAR) 50 MG tablet    Sig: Take 1 tablet (50 mg total) by mouth daily.    Dispense:  30 tablet    Refill:  5    Partially dictated using Editor, commissioning. Any errors are unintentional.  Halina Maidens, MD [facility]  09/06/2017

## 2017-09-06 NOTE — Patient Instructions (Signed)
BP goal is 130/80  DASH Eating Plan DASH stands for "Dietary Approaches to Stop Hypertension." The DASH eating plan is a healthy eating plan that has been shown to reduce high blood pressure (hypertension). It may also reduce your risk for type 2 diabetes, heart disease, and stroke. The DASH eating plan may also help with weight loss. What are tips for following this plan? General guidelines  Avoid eating more than 2,300 mg (milligrams) of salt (sodium) a day. If you have hypertension, you may need to reduce your sodium intake to 1,500 mg a day.  Limit alcohol intake to no more than 1 drink a day for nonpregnant women and 2 drinks a day for men. One drink equals 12 oz of beer, 5 oz of wine, or 1 oz of hard liquor.  Work with your health care provider to maintain a healthy body weight or to lose weight. Ask what an ideal weight is for you.  Get at least 30 minutes of exercise that causes your heart to beat faster (aerobic exercise) most days of the week. Activities may include walking, swimming, or biking.  Work with your health care provider or diet and nutrition specialist (dietitian) to adjust your eating plan to your individual calorie needs. Reading food labels  Check food labels for the amount of sodium per serving. Choose foods with less than 5 percent of the Daily Value of sodium. Generally, foods with less than 300 mg of sodium per serving fit into this eating plan.  To find whole grains, look for the word "whole" as the first word in the ingredient list. Shopping  Buy products labeled as "low-sodium" or "no salt added."  Buy fresh foods. Avoid canned foods and premade or frozen meals. Cooking  Avoid adding salt when cooking. Use salt-free seasonings or herbs instead of table salt or sea salt. Check with your health care provider or pharmacist before using salt substitutes.  Do not fry foods. Cook foods using healthy methods such as baking, boiling, grilling, and broiling  instead.  Cook with heart-healthy oils, such as olive, canola, soybean, or sunflower oil. Meal planning   Eat a balanced diet that includes: ? 5 or more servings of fruits and vegetables each day. At each meal, try to fill half of your plate with fruits and vegetables. ? Up to 6-8 servings of whole grains each day. ? Less than 6 oz of lean meat, poultry, or fish each day. A 3-oz serving of meat is about the same size as a deck of cards. One egg equals 1 oz. ? 2 servings of low-fat dairy each day. ? A serving of nuts, seeds, or beans 5 times each week. ? Heart-healthy fats. Healthy fats called Omega-3 fatty acids are found in foods such as flaxseeds and coldwater fish, like sardines, salmon, and mackerel.  Limit how much you eat of the following: ? Canned or prepackaged foods. ? Food that is high in trans fat, such as fried foods. ? Food that is high in saturated fat, such as fatty meat. ? Sweets, desserts, sugary drinks, and other foods with added sugar. ? Full-fat dairy products.  Do not salt foods before eating.  Try to eat at least 2 vegetarian meals each week.  Eat more home-cooked food and less restaurant, buffet, and fast food.  When eating at a restaurant, ask that your food be prepared with less salt or no salt, if possible. What foods are recommended? The items listed may not be a complete list. Talk  with your dietitian about what dietary choices are best for you. Grains Whole-grain or whole-wheat bread. Whole-grain or whole-wheat pasta. Brown rice. [place]. Bulgur. Whole-grain and low-sodium cereals. Pita bread. Low-fat, low-sodium crackers. Whole-wheat flour tortillas. Vegetables Fresh or frozen vegetables (raw, steamed, roasted, or grilled). Low-sodium or reduced-sodium tomato and vegetable juice. Low-sodium or reduced-sodium tomato sauce and tomato paste. Low-sodium or reduced-sodium canned vegetables. Fruits All fresh, dried, or frozen fruit. Canned fruit in  natural juice (without added sugar). Meat and other protein foods Skinless chicken or [place]. Ground chicken or [place]. Pork with fat trimmed off. Fish and seafood. Egg whites. Dried beans, peas, or lentils. Unsalted nuts, nut butters, and seeds. Unsalted canned beans. Lean cuts of beef with fat trimmed off. Low-sodium, lean deli meat. Dairy Low-fat (1%) or fat-free (skim) milk. Fat-free, low-fat, or reduced-fat cheeses. Nonfat, low-sodium ricotta or cottage cheese. Low-fat or nonfat yogurt. Low-fat, low-sodium cheese. Fats and oils Soft margarine without trans fats. Vegetable oil. Low-fat, reduced-fat, or light mayonnaise and salad dressings (reduced-sodium). Canola, safflower, olive, soybean, and sunflower oils. Avocado. Seasoning and other foods Herbs. Spices. Seasoning mixes without salt. Unsalted popcorn and pretzels. Fat-free sweets. What foods are not recommended? The items listed may not be a complete list. Talk with your dietitian about what dietary choices are best for you. Grains Baked goods made with fat, such as croissants, muffins, or some breads. Dry pasta or rice meal packs. Vegetables Creamed or fried vegetables. Vegetables in a cheese sauce. Regular canned vegetables (not low-sodium or reduced-sodium). Regular canned tomato sauce and paste (not low-sodium or reduced-sodium). Regular tomato and vegetable juice (not low-sodium or reduced-sodium). Angie Fava. Olives. Fruits Canned fruit in a light or heavy syrup. Fried fruit. Fruit in cream or butter sauce. Meat and other protein foods Fatty cuts of meat. Ribs. Fried meat. Berniece Salines. Sausage. Bologna and other processed lunch meats. Salami. Fatback. Hotdogs. Bratwurst. Salted nuts and seeds. Canned beans with added salt. Canned or smoked fish. Whole eggs or egg yolks. Chicken or [place] with skin. Dairy Whole or 2% milk, cream, and half-and-half. Whole or full-fat cream cheese. Whole-fat or sweetened yogurt. Full-fat cheese. Nondairy  creamers. Whipped toppings. Processed cheese and cheese spreads. Fats and oils Butter. Stick margarine. Lard. Shortening. Ghee. Bacon fat. Tropical oils, such as coconut, palm kernel, or palm oil. Seasoning and other foods Salted popcorn and pretzels. Onion salt, garlic salt, seasoned salt, table salt, and sea salt. Worcestershire sauce. Tartar sauce. Barbecue sauce. Teriyaki sauce. Soy sauce, including reduced-sodium. Steak sauce. Canned and packaged gravies. Fish sauce. Oyster sauce. Cocktail sauce. Horseradish that you find on the shelf. Ketchup. Mustard. Meat flavorings and tenderizers. Bouillon cubes. Hot sauce and Tabasco sauce. Premade or packaged marinades. Premade or packaged taco seasonings. Relishes. Regular salad dressings. Where to find more information:  National Heart, Lung, and [place]: https://wilson-eaton.com/  American Heart Association: www.heart.org Summary  The DASH eating plan is a healthy eating plan that has been shown to reduce high blood pressure (hypertension). It may also reduce your risk for type 2 diabetes, heart disease, and stroke.  With the DASH eating plan, you should limit salt (sodium) intake to 2,300 mg a day. If you have hypertension, you may need to reduce your sodium intake to 1,500 mg a day.  When on the DASH eating plan, aim to eat more fresh fruits and vegetables, whole grains, lean proteins, low-fat dairy, and heart-healthy fats.  Work with your health care provider or diet and nutrition specialist (dietitian) to adjust your  eating plan to your individual calorie needs. This information is not intended to replace advice given to you by your health care provider. Make sure you discuss any questions you have with your health care provider. Document Released: 11/22/2011 Document Revised: 11/26/2016 Document Reviewed: 11/26/2016 Elsevier Interactive Patient Education  2017 [employer].

## 2017-09-07 LAB — COMPREHENSIVE METABOLIC PANEL
A/G RATIO: 1.9 (ref 1.2–2.2)
ALBUMIN: 4.4 g/dL (ref 3.5–5.5)
ALK PHOS: 88 IU/L (ref 39–117)
ALT: 26 IU/L (ref 0–44)
AST: 20 IU/L (ref 0–40)
BILIRUBIN TOTAL: 0.9 mg/dL (ref 0.0–1.2)
BUN / CREAT RATIO: 12 (ref 9–20)
BUN: 11 mg/dL (ref 6–24)
CHLORIDE: 103 mmol/L (ref 96–106)
CO2: 24 mmol/L (ref 20–29)
Calcium: 9 mg/dL (ref 8.7–10.2)
Creatinine, Ser: 0.89 mg/dL (ref 0.76–1.27)
GFR calc Af Amer: 116 mL/min/{1.73_m2} (ref >59)
GFR calc non Af Amer: 100 mL/min/{1.73_m2} (ref >59)
GLOBULIN, TOTAL: 2.3 g/dL (ref 1.5–4.5)
Glucose: 81 mg/dL (ref 65–99)
POTASSIUM: 4.3 mmol/L (ref 3.5–5.2)
Sodium: 141 mmol/L (ref 134–144)
Total Protein: 6.7 g/dL (ref 6.0–8.5)

## 2017-09-07 LAB — PSA: Prostate Specific Ag, Serum: 0.4 ng/mL (ref 0.0–4.0)

## 2017-09-07 LAB — CBC WITH DIFFERENTIAL/PLATELET
Basophils Absolute: 0 10*3/uL (ref 0.0–0.2)
Basos: 1 %
EOS (ABSOLUTE): 0.2 10*3/uL (ref 0.0–0.4)
EOS: 2 %
HEMATOCRIT: 45.7 % (ref 37.5–51.0)
HEMOGLOBIN: 15.2 g/dL (ref 13.0–17.7)
Immature Grans (Abs): 0 10*3/uL (ref 0.0–0.1)
Immature Granulocytes: 0 %
LYMPHS ABS: 2 10*3/uL (ref 0.7–3.1)
Lymphs: 31 %
MCH: 28.8 pg (ref 26.6–33.0)
MCHC: 33.3 g/dL (ref 31.5–35.7)
MCV: 87 fL (ref 79–97)
MONOCYTES: 8 %
MONOS ABS: 0.5 10*3/uL (ref 0.1–0.9)
NEUTROS ABS: 3.8 10*3/uL (ref 1.4–7.0)
Neutrophils: 58 %
Platelets: 182 10*3/uL (ref 150–379)
RBC: 5.28 x10E6/uL (ref 4.14–5.80)
RDW: 13.8 % (ref 12.3–15.4)
WBC: 6.5 10*3/uL (ref 3.4–10.8)

## 2017-09-07 LAB — LIPID PANEL
CHOLESTEROL TOTAL: 222 mg/dL — AB (ref 100–199)
Chol/HDL Ratio: 5.3 ratio — ABNORMAL HIGH (ref 0.0–5.0)
HDL: 42 mg/dL (ref >39)
LDL CALC: 146 mg/dL — AB (ref 0–99)
Triglycerides: 171 mg/dL — ABNORMAL HIGH (ref 0–149)
VLDL Cholesterol Cal: 34 mg/dL (ref 5–40)

## 2017-09-09 ENCOUNTER — Other Ambulatory Visit: Payer: Self-pay | Admitting: Internal Medicine

## 2017-09-09 DIAGNOSIS — E782 Mixed hyperlipidemia: Secondary | ICD-10-CM

## 2017-09-09 MED ORDER — ATORVASTATIN CALCIUM 10 MG PO TABS: 10.0000 mg | ORAL_TABLET | Freq: Every day | ORAL | 1 refills | Status: DC

## 2017-09-09 NOTE — Progress Notes (Signed)
Patient informed of labs and elevated cholesterol- will start medication. Wants sent to [facility] in [place]. Has follow up scheduled in December.

## 2017-12-13 ENCOUNTER — Ambulatory Visit (INDEPENDENT_AMBULATORY_CARE_PROVIDER_SITE_OTHER): Payer: BLUE CROSS/BLUE SHIELD | Admitting: Internal Medicine

## 2017-12-13 ENCOUNTER — Encounter: Payer: Self-pay | Admitting: Internal Medicine

## 2017-12-13 VITALS — BP 128/82 | HR 64 | Ht 72.0 in | Wt 211.0 lb

## 2017-12-13 DIAGNOSIS — E782 Mixed hyperlipidemia: Secondary | ICD-10-CM

## 2017-12-13 DIAGNOSIS — I10 Essential (primary) hypertension: Secondary | ICD-10-CM

## 2017-12-13 DIAGNOSIS — K219 Gastro-esophageal reflux disease without esophagitis: Secondary | ICD-10-CM

## 2017-12-13 NOTE — Progress Notes (Signed)
Date:  12/13/2017   Name:  Lawrence Vega   DOB:  [birth year]   MRN:  [id number]   Chief Complaint: Hypertension and Hyperlipidemia (States he can't figure out which medication is causes severe heartburn. )  Hypertension  The problem has been gradually improving since onset. Pertinent negatives include no chest pain, headaches, palpitations or shortness of breath.  Hyperlipidemia  This is a chronic problem. Pertinent negatives include no chest pain or shortness of breath. Current antihyperlipidemic treatment includes statins (atorvastatin). Compliance problems include medication side effects (gerd possibly from lipitor).   Gastroesophageal Reflux  He complains of abdominal pain and heartburn. He reports no chest pain, no sore throat, no water brash or no wheezing. This is a new problem. The current episode started 1 to 4 weeks ago. The problem occurs frequently. The heartburn duration is several minutes. The heartburn is located in the substernum. The heartburn is of moderate intensity. Pertinent negatives include no fatigue. He has tried a PPI (prilosec for the past 3 days - may be helping some) for the symptoms.     Review of Systems  Constitutional: Negative for chills, fatigue and fever.  HENT: Negative for sore throat.   Eyes: Negative for visual disturbance.  Respiratory: Negative for chest tightness, shortness of breath and wheezing.   Cardiovascular: Negative for chest pain, palpitations and leg swelling.  Gastrointestinal: Positive for abdominal pain and heartburn. Negative for blood in stool, constipation and diarrhea.  Neurological: Negative for dizziness and headaches.    Patient Active Problem List   Diagnosis Date Noted  . Wheezing 09/06/2017  . Benign prostatic hyperplasia 09/23/2015  . 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 Hyperlipidemia 09/23/2015  . Tobacco use disorder, mild, in sustained remission 09/23/2015  . Hypertension 08/31/2011    Prior to  Admission medications   Medication Sig Start Date End Date Taking? Authorizing Provider  albuterol (PROVENTIL HFA;VENTOLIN HFA) 108 (90 Base) MCG/ACT inhaler Inhale 2 puffs into the lungs every 6 (six) hours as needed for wheezing or shortness of breath. 09/06/17  Yes Glean Hess, MD  atorvastatin (LIPITOR) 10 MG tablet Take 1 tablet (10 mg total) by mouth daily. 09/09/17  Yes Glean Hess, MD  losartan (COZAAR) 50 MG tablet Take 1 tablet (50 mg total) by mouth daily. 09/06/17  Yes Glean Hess, MD    No Known Allergies  Past Surgical History:  Procedure Laterality Date  . APPENDECTOMY    . FRACTURE SURGERY    . HEMORRHOID SURGERY      Social History   Tobacco Use  . Smoking status: Former Smoker    Packs/day: 2.00    Years: 25.00    Pack years: 50.00    Types: Cigarettes    Last attempt to quit: 01/18/2011    Years since quitting: 6.9  . Smokeless tobacco: Never Used  Substance Use Topics  . Alcohol use: No    Alcohol/week: 0.0 oz  . Drug use: No     Medication list has been reviewed and updated.  PHQ 2/9 Scores 09/06/2017  PHQ - 2 Score 0    Physical Exam  Constitutional: He is oriented to person, place, and time. He appears well-developed. No distress.  HENT:  Head: Normocephalic and atraumatic.  Neck: Normal range of motion. Neck supple.  Cardiovascular: Normal rate, regular rhythm and normal heart sounds.  Pulmonary/Chest: Effort normal and breath sounds normal. No respiratory distress. He has no wheezes.  Abdominal: Soft. Bowel sounds are normal. He  exhibits no distension and no mass. There is no tenderness. There is no rebound and no guarding.  Musculoskeletal: Normal range of motion.  Neurological: He is alert and oriented to person, place, and time.  Skin: Skin is warm and dry. No rash noted.  Psychiatric: He has a normal mood and affect. His behavior is normal. Thought content normal.  Nursing note and vitals reviewed.   BP 128/82   Pulse 64    Ht 6' (1.829 m)   Wt 211 lb (95.7 kg)   SpO2 99%   BMI 28.62 kg/m   Assessment and Plan: 1. Essential hypertension Controlled - continue losartan  2. Mixed hyperlipidemia May be causing gerd - will hold lipitor and prilosce for 2 weeks then call back - Comprehensive metabolic panel - Lipid panel  3. Gastroesophageal reflux disease, esophagitis presence not specified - CBC with Differential/Platelet   No orders of the defined types were placed in this encounter.   Partially dictated using Editor, commissioning. Any errors are unintentional.  Halina Maidens, MD [facility]  12/13/2017

## 2017-12-14 LAB — COMPREHENSIVE METABOLIC PANEL
ALK PHOS: 92 IU/L (ref 39–117)
ALT: 32 IU/L (ref 0–44)
AST: 27 IU/L (ref 0–40)
Albumin/Globulin Ratio: 1.8 (ref 1.2–2.2)
Albumin: 4.6 g/dL (ref 3.5–5.5)
BILIRUBIN TOTAL: 0.6 mg/dL (ref 0.0–1.2)
BUN/Creatinine Ratio: 14 (ref 9–20)
BUN: 13 mg/dL (ref 6–24)
CHLORIDE: 99 mmol/L (ref 96–106)
CO2: 21 mmol/L (ref 20–29)
Calcium: 9 mg/dL (ref 8.7–10.2)
Creatinine, Ser: 0.96 mg/dL (ref 0.76–1.27)
GFR calc Af Amer: 107 mL/min/{1.73_m2} (ref >59)
GFR calc non Af Amer: 92 mL/min/{1.73_m2} (ref >59)
GLUCOSE: 72 mg/dL (ref 65–99)
Globulin, Total: 2.5 g/dL (ref 1.5–4.5)
Potassium: 3.8 mmol/L (ref 3.5–5.2)
Sodium: 141 mmol/L (ref 134–144)
Total Protein: 7.1 g/dL (ref 6.0–8.5)

## 2017-12-14 LAB — CBC WITH DIFFERENTIAL/PLATELET
BASOS: 0 %
Basophils Absolute: 0 10*3/uL (ref 0.0–0.2)
EOS (ABSOLUTE): 0.1 10*3/uL (ref 0.0–0.4)
EOS: 1 %
HEMATOCRIT: 44.9 % (ref 37.5–51.0)
HEMOGLOBIN: 15.3 g/dL (ref 13.0–17.7)
IMMATURE GRANULOCYTES: 0 %
Immature Grans (Abs): 0 10*3/uL (ref 0.0–0.1)
LYMPHS ABS: 1.7 10*3/uL (ref 0.7–3.1)
Lymphs: 19 %
MCH: 29.3 pg (ref 26.6–33.0)
MCHC: 34.1 g/dL (ref 31.5–35.7)
MCV: 86 fL (ref 79–97)
MONOS ABS: 0.8 10*3/uL (ref 0.1–0.9)
Monocytes: 9 %
NEUTROS PCT: 71 %
Neutrophils Absolute: 6.5 10*3/uL (ref 1.4–7.0)
Platelets: 208 10*3/uL (ref 150–379)
RBC: 5.23 x10E6/uL (ref 4.14–5.80)
RDW: 13.4 % (ref 12.3–15.4)
WBC: 9.2 10*3/uL (ref 3.4–10.8)

## 2017-12-14 LAB — LIPID PANEL
CHOLESTEROL TOTAL: 201 mg/dL — AB (ref 100–199)
Chol/HDL Ratio: 4.2 ratio (ref 0.0–5.0)
HDL: 48 mg/dL (ref >39)
LDL Calculated: 130 mg/dL — ABNORMAL HIGH (ref 0–99)
Triglycerides: 113 mg/dL (ref 0–149)
VLDL Cholesterol Cal: 23 mg/dL (ref 5–40)

## 2017-12-17 HISTORY — PX: CATARACT EXTRACTION, BILATERAL: SHX1313

## 2018-01-17 ENCOUNTER — Ambulatory Visit (INDEPENDENT_AMBULATORY_CARE_PROVIDER_SITE_OTHER): Payer: BLUE CROSS/BLUE SHIELD | Admitting: Internal Medicine

## 2018-01-17 ENCOUNTER — Encounter: Payer: Self-pay | Admitting: Internal Medicine

## 2018-01-17 VITALS — BP 152/84 | HR 70 | Temp 98.6°F | Ht 72.0 in | Wt 212.0 lb

## 2018-01-17 DIAGNOSIS — J40 Bronchitis, not specified as acute or chronic: Secondary | ICD-10-CM

## 2018-01-17 MED ORDER — HYDROCOD POLST-CPM POLST ER 10-8 MG/5ML PO SUER: 5.0000 mL | Freq: Two times a day (BID) | ORAL | 0 refills | Status: AC

## 2018-01-17 MED ORDER — ALBUTEROL SULFATE HFA 108 (90 BASE) MCG/ACT IN AERS: 2.0000 | INHALATION_SPRAY | Freq: Four times a day (QID) | RESPIRATORY_TRACT | 0 refills | Status: DC | PRN

## 2018-01-17 MED ORDER — DOXYCYCLINE HYCLATE 100 MG PO TABS: 100.0000 mg | ORAL_TABLET | Freq: Two times a day (BID) | ORAL | 0 refills | Status: AC

## 2018-01-17 NOTE — Patient Instructions (Addendum)
Use inhaler 2 puffs every 6 hours as needed  Mucinex-DM twice a day

## 2018-01-17 NOTE — Progress Notes (Signed)
Date:  01/17/2018   Name:  Lawrence Vega   DOB:  Jul 31, 1966   MRN:  [id number]   Chief Complaint: Cough (X 1 week. Green/ brown production with cough. Runny nose. Congestion worse at night and morning. Coughing causing ribs to be sore. ) URI   This is a new problem. The current episode started in the past 7 days. The problem has been gradually worsening. There has been no fever. Associated symptoms include congestion, coughing, sneezing, a sore throat and wheezing. Pertinent negatives include no abdominal pain or chest pain.    Review of Systems  Constitutional: Negative for chills, fatigue and fever.  HENT: Positive for congestion, postnasal drip, sneezing and sore throat.   Respiratory: Positive for cough and wheezing. Negative for chest tightness.   Cardiovascular: Negative for chest pain and palpitations.  Gastrointestinal: Negative for abdominal pain.    Patient Active Problem List   Diagnosis Date Noted  . Wheezing 09/06/2017  . Benign prostatic hyperplasia 09/23/2015  . 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 Hyperlipidemia 09/23/2015  . Tobacco use disorder, mild, in sustained remission 09/23/2015  . Hypertension 08/31/2011    Prior to Admission medications   Medication Sig Start Date End Date Taking? Authorizing Provider  albuterol (PROVENTIL HFA;VENTOLIN HFA) 108 (90 Base) MCG/ACT inhaler Inhale 2 puffs into the lungs every 6 (six) hours as needed for wheezing or shortness of breath. 09/06/17   Glean Hess, MD  atorvastatin (LIPITOR) 10 MG tablet Take 1 tablet (10 mg total) by mouth daily. 09/09/17   Glean Hess, MD  losartan (COZAAR) 50 MG tablet Take 1 tablet (50 mg total) by mouth daily. 09/06/17   Glean Hess, MD    No Known Allergies  Past Surgical History:  Procedure Laterality Date  . APPENDECTOMY    . FRACTURE SURGERY    . HEMORRHOID SURGERY      Social History   Tobacco Use  . Smoking status: Former Smoker    Packs/day: 2.00   Years: 25.00    Pack years: 50.00    Types: Cigarettes    Last attempt to quit: 01/18/2011    Years since quitting: 7.0  . Smokeless tobacco: Never Used  Substance Use Topics  . Alcohol use: No    Alcohol/week: 0.0 oz  . Drug use: No     Medication list has been reviewed and updated.  PHQ 2/9 Scores 09/06/2017  PHQ - 2 Score 0    Physical Exam  Constitutional: He is oriented to person, place, and time. He appears well-developed. No distress.  HENT:  Head: Normocephalic and atraumatic.  Right Ear: Tympanic membrane and ear canal normal.  Left Ear: Tympanic membrane and ear canal normal.  Nose: Right sinus exhibits no maxillary sinus tenderness. Left sinus exhibits no maxillary sinus tenderness.  Mouth/Throat: Posterior oropharyngeal erythema present. No oropharyngeal exudate or posterior oropharyngeal edema.  Eyes: Pupils are equal, round, and reactive to light.  Neck: Normal range of motion. Neck supple. No thyromegaly present.  Cardiovascular: Normal rate, regular rhythm and normal heart sounds.  Pulmonary/Chest: Effort normal. No respiratory distress. He has wheezes. He has no rales.  Musculoskeletal: Normal range of motion.  Neurological: He is alert and oriented to person, place, and time.  Skin: Skin is warm and dry. No rash noted.  Psychiatric: He has a normal mood and affect. His speech is normal and behavior is normal. Thought content normal.  Nursing note and vitals reviewed.   BP (!) 152/84  Pulse 70   Temp 98.6 F (37 C) (Oral)   Ht 6' (1.829 m)   Wt 212 lb (96.2 kg)   SpO2 96%   BMI 28.75 kg/m   Assessment and Plan: 1. Bronchitis Use Mucinex-DM Fluids, tussionex at night - doxycycline (VIBRA-TABS) 100 MG tablet; Take 1 tablet (100 mg total) by mouth 2 (two) times daily for 10 days.  Dispense: 20 tablet; Refill: 0 - albuterol (PROVENTIL HFA;VENTOLIN HFA) 108 (90 Base) MCG/ACT inhaler; Inhale 2 puffs into the lungs every 6 (six) hours as needed for  wheezing or shortness of breath.  Dispense: 1 Inhaler; Refill: 0 - chlorpheniramine-HYDROcodone (TUSSIONEX PENNKINETIC ER) 10-8 MG/5ML SUER; Take 5 mLs by mouth 2 (two) times daily for 10 days.  Dispense: 115 mL; Refill: 0   Meds ordered this encounter  Medications  . doxycycline (VIBRA-TABS) 100 MG tablet    Sig: Take 1 tablet (100 mg total) by mouth 2 (two) times daily for 10 days.    Dispense:  20 tablet    Refill:  0  . albuterol (PROVENTIL HFA;VENTOLIN HFA) 108 (90 Base) MCG/ACT inhaler    Sig: Inhale 2 puffs into the lungs every 6 (six) hours as needed for wheezing or shortness of breath.    Dispense:  1 Inhaler    Refill:  0  . chlorpheniramine-HYDROcodone (TUSSIONEX PENNKINETIC ER) 10-8 MG/5ML SUER    Sig: Take 5 mLs by mouth 2 (two) times daily for 10 days.    Dispense:  115 mL    Refill:  0    Partially dictated using Editor, commissioning. Any errors are unintentional.  Halina Maidens, MD [facility]  01/17/2018

## 2018-04-11 ENCOUNTER — Ambulatory Visit: Payer: Self-pay | Admitting: Internal Medicine

## 2018-08-29 ENCOUNTER — Encounter: Payer: Self-pay | Admitting: Internal Medicine

## 2018-08-29 ENCOUNTER — Ambulatory Visit (INDEPENDENT_AMBULATORY_CARE_PROVIDER_SITE_OTHER): Payer: BLUE CROSS/BLUE SHIELD | Admitting: Internal Medicine

## 2018-08-29 VITALS — BP 112/80 | HR 64 | Ht 72.0 in | Wt 204.0 lb

## 2018-08-29 DIAGNOSIS — Z23 Encounter for immunization: Secondary | ICD-10-CM | POA: Diagnosis not present

## 2018-08-29 DIAGNOSIS — M720 Palmar fascial fibromatosis [Dupuytren]: Secondary | ICD-10-CM

## 2018-08-29 DIAGNOSIS — E782 Mixed hyperlipidemia: Secondary | ICD-10-CM

## 2018-08-29 DIAGNOSIS — Z87891 Personal history of nicotine dependence: Secondary | ICD-10-CM

## 2018-08-29 DIAGNOSIS — Z1211 Encounter for screening for malignant neoplasm of colon: Secondary | ICD-10-CM

## 2018-08-29 DIAGNOSIS — I10 Essential (primary) hypertension: Secondary | ICD-10-CM

## 2018-08-29 DIAGNOSIS — R079 Chest pain, unspecified: Secondary | ICD-10-CM | POA: Diagnosis not present

## 2018-08-29 DIAGNOSIS — N400 Enlarged prostate without lower urinary tract symptoms: Secondary | ICD-10-CM

## 2018-08-29 DIAGNOSIS — Z0000 Encounter for general adult medical examination without abnormal findings: Secondary | ICD-10-CM | POA: Diagnosis not present

## 2018-08-29 DIAGNOSIS — F17201 Nicotine dependence, unspecified, in remission: Secondary | ICD-10-CM

## 2018-08-29 LAB — POCT URINALYSIS DIPSTICK
BILIRUBIN UA: NEGATIVE
GLUCOSE UA: NEGATIVE
KETONES UA: NEGATIVE
Leukocytes, UA: NEGATIVE
PH UA: 6.5 (ref 5.0–8.0)
Protein, UA: NEGATIVE
RBC UA: NEGATIVE
Spec Grav, UA: 1.015 (ref 1.010–1.025)
UROBILINOGEN UA: 0.2 U/dL

## 2018-08-29 MED ORDER — LOSARTAN POTASSIUM 50 MG PO TABS: 50.0000 mg | ORAL_TABLET | Freq: Every day | ORAL | 5 refills | Status: DC

## 2018-08-29 MED ORDER — ATORVASTATIN CALCIUM 10 MG PO TABS: 10.0000 mg | ORAL_TABLET | Freq: Every day | ORAL | 1 refills | Status: DC

## 2018-08-29 NOTE — Progress Notes (Signed)
Date:  08/29/2018   Name:  Lawrence Vega   DOB:  02/28/1966   MRN:  [id number]   Chief Complaint: Annual Exam (Patient was told to stop taking Atorvastatin shortly after starting it because he was having severe heart burn. Still having heartburn after stopping taking it. I reviewed chart notes with patient and told him the heartburn seemed to of started after losartan began. Explained he was not started on both medications at the same time and the heartburn came before the cholesterol medications. He ssaid the heartburn comes with chest pain and radiation down left arm pain . ) Lawrence Vega is a 52 y.o. male who presents today for his Complete Annual Exam. He feels fairly well. He reports exercising some. He reports he is sleeping well. He is due for colonoscopy.  We discussed low dose CT for lung cancer screening and he agrees. Waist circumference: 39.5 in.  Hypertension  This is a chronic problem. The problem is controlled. Associated symptoms include chest pain. Pertinent negatives include no headaches, palpitations or shortness of breath. Past treatments include angiotensin blockers. The current treatment provides significant improvement. There is no history of kidney disease or CAD/MI.  Hyperlipidemia  This is a chronic problem. Associated symptoms include chest pain. Pertinent negatives include no shortness of breath. Treatments tried: stop lipitor months ago. There are no compliance problems.   Benign Prostatic Hypertrophy  This is a chronic problem. The problem is unchanged. Pertinent negatives include no chills or hematuria. Past treatments include tamsulosin. The treatment provided no relief.  Chest Pain   This is a new problem. The current episode started 1 to 4 weeks ago. The problem occurs every several days. The problem has been unchanged. The pain is present in the substernal region. The pain is moderate. The quality of the pain is described as pressure. The pain radiates to  the left shoulder. Pertinent negatives include no cough, dizziness, fever, headaches, palpitations or shortness of breath. He has tried antacids for the symptoms. The treatment provided no relief.  His past medical history is significant for hyperlipidemia and hypertension.     Review of Systems  Constitutional: Negative for chills, fatigue and fever.  HENT: Negative for trouble swallowing.   Respiratory: Positive for chest tightness. Negative for cough, shortness of breath and wheezing.   Cardiovascular: Positive for chest pain. Negative for palpitations and leg swelling.  Gastrointestinal: Negative for blood in stool, constipation and diarrhea.  Genitourinary: Negative for difficulty urinating and hematuria.  Musculoskeletal: Negative for arthralgias and gait problem.  Skin: Negative for pallor and rash.  Neurological: Negative for dizziness and headaches.  Psychiatric/Behavioral: Negative for dysphoric mood and sleep disturbance.    Patient Active Problem List   Diagnosis Date Noted  . Benign prostatic hyperplasia 09/23/2015  . 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 Hyperlipidemia 09/23/2015  . Tobacco use disorder, mild, in sustained remission 09/23/2015  . Hypertension 08/31/2011    No Known Allergies  Past Surgical History:  Procedure Laterality Date  . APPENDECTOMY    . FRACTURE SURGERY    . HEMORRHOID SURGERY      Social History   Tobacco Use  . Smoking status: Former Smoker    Packs/day: 2.00    Years: 25.00    Pack years: 50.00    Types: Cigarettes    Last attempt to quit: 01/18/2011    Years since quitting: 7.6  . Smokeless tobacco: Never Used  Substance Use Topics  . Alcohol use: No  Alcohol/week: 0.0 standard drinks  . Drug use: No     Medication list has been reviewed and updated.  Current Meds  Medication Sig  . albuterol (PROVENTIL HFA;VENTOLIN HFA) 108 (90 Base) MCG/ACT inhaler Inhale 2 puffs into the lungs every 6 (six) hours as needed for  wheezing or shortness of breath.  . losartan (COZAAR) 50 MG tablet Take 1 tablet (50 mg total) by mouth daily.  Marland Kitchen omeprazole (PRILOSEC) 10 MG capsule Take 10 mg by mouth daily.    PHQ 2/9 Scores 08/29/2018 09/06/2017  PHQ - 2 Score 0 0    Physical Exam  Constitutional: He is oriented to person, place, and time. He appears well-developed and well-nourished.  HENT:  Head: Normocephalic.  Right Ear: Tympanic membrane, external ear and ear canal normal.  Left Ear: Tympanic membrane, external ear and ear canal normal.  Nose: Nose normal.  Mouth/Throat: Uvula is midline and oropharynx is clear and moist.  Eyes: Pupils are equal, round, and reactive to light. Conjunctivae and EOM are normal.  Neck: Normal range of motion. Neck supple. Carotid bruit is not present. No thyromegaly present.  Cardiovascular: Normal rate, regular rhythm, normal heart sounds and intact distal pulses.  Pulmonary/Chest: Effort normal and breath sounds normal. He has no wheezes. Right breast exhibits no mass. Left breast exhibits no mass.  Abdominal: Soft. Normal appearance and bowel sounds are normal. There is no hepatosplenomegaly. There is no tenderness.  Musculoskeletal: Normal range of motion.  Lymphadenopathy:    He has no cervical adenopathy.  Neurological: He is alert and oriented to person, place, and time. He has normal reflexes.  Skin: Skin is warm, dry and intact.  Palmar fibrosis noted in both hands  Psychiatric: He has a normal mood and affect. His speech is normal and behavior is normal. Judgment and thought content normal.  Nursing note and vitals reviewed.   BP 112/80 (BP Location: Right Arm, Patient Position: Sitting, Cuff Size: Normal)   Pulse 64   Ht 6' (1.829 m)   Wt 204 lb (92.5 kg)   SpO2 97%   BMI 27.67 kg/m   Assessment and Plan: 1. Annual physical exam Continue healthy diet, exercise and weight loss - POCT urinalysis dipstick  2. Essential hypertension Controlled on losartan 25  mg - CBC with Differential/Platelet - Comprehensive metabolic panel - losartan (COZAAR) 50 MG tablet; Take 1 tablet (50 mg total) by mouth daily.  Dispense: 30 tablet; Refill: 5  3. Mixed hyperlipidemia Resume atorvastatin - Lipid panel - atorvastatin (LIPITOR) 10 MG tablet; Take 1 tablet (10 mg total) by mouth daily.  Dispense: 90 tablet; Refill: 1 - Ambulatory referral to Cardiology  4. Benign prostatic hyperplasia, unspecified whether lower urinary tract symptoms present DRE deferred - PSA  5. Colon cancer screening - Ambulatory referral to Gastroenterology  6. Palmar fascial fibromatosis (dupuytren)  7. Chest pain at rest - EKG 12-Lead - NSR @ 60, low voltage - Ambulatory referral to Cardiology  8. Tobacco use disorder, mild, in sustained remission Low dose CT referral done  9. Need for influenza vaccination - Flu Vaccine QUAD 36+ mos IM   Meds ordered this encounter  Medications  . atorvastatin (LIPITOR) 10 MG tablet    Sig: Take 1 tablet (10 mg total) by mouth daily.    Dispense:  90 tablet    Refill:  1  . losartan (COZAAR) 50 MG tablet    Sig: Take 1 tablet (50 mg total) by mouth daily.    Dispense:  30 tablet    Refill:  5    Partially dictated using Editor, commissioning. Any errors are unintentional.  Halina Maidens, MD [facility]  08/29/2018

## 2018-08-30 LAB — CBC WITH DIFFERENTIAL/PLATELET
Basophils Absolute: 0 10*3/uL (ref 0.0–0.2)
Basos: 1 %
EOS (ABSOLUTE): 0.2 10*3/uL (ref 0.0–0.4)
Eos: 2 %
HEMATOCRIT: 47.6 % (ref 37.5–51.0)
Hemoglobin: 15.6 g/dL (ref 13.0–17.7)
IMMATURE GRANS (ABS): 0 10*3/uL (ref 0.0–0.1)
Immature Granulocytes: 0 %
LYMPHS ABS: 2 10*3/uL (ref 0.7–3.1)
LYMPHS: 27 %
MCH: 27.1 pg (ref 26.6–33.0)
MCHC: 32.8 g/dL (ref 31.5–35.7)
MCV: 83 fL (ref 79–97)
MONOCYTES: 8 %
Monocytes Absolute: 0.6 10*3/uL (ref 0.1–0.9)
Neutrophils Absolute: 4.4 10*3/uL (ref 1.4–7.0)
Neutrophils: 62 %
Platelets: 214 10*3/uL (ref 150–450)
RBC: 5.75 x10E6/uL (ref 4.14–5.80)
RDW: 13.1 % (ref 12.3–15.4)
WBC: 7.1 10*3/uL (ref 3.4–10.8)

## 2018-08-30 LAB — COMPREHENSIVE METABOLIC PANEL
A/G RATIO: 2 (ref 1.2–2.2)
ALT: 18 IU/L (ref 0–44)
AST: 15 IU/L (ref 0–40)
Albumin: 4.3 g/dL (ref 3.5–5.5)
Alkaline Phosphatase: 82 IU/L (ref 39–117)
BUN/Creatinine Ratio: 12 (ref 9–20)
BUN: 12 mg/dL (ref 6–24)
Bilirubin Total: 0.8 mg/dL (ref 0.0–1.2)
CALCIUM: 9.1 mg/dL (ref 8.7–10.2)
CO2: 22 mmol/L (ref 20–29)
Chloride: 100 mmol/L (ref 96–106)
Creatinine, Ser: 0.99 mg/dL (ref 0.76–1.27)
GFR, EST AFRICAN AMERICAN: 102 mL/min/{1.73_m2} (ref >59)
GFR, EST NON AFRICAN AMERICAN: 88 mL/min/{1.73_m2} (ref >59)
GLOBULIN, TOTAL: 2.2 g/dL (ref 1.5–4.5)
Glucose: 88 mg/dL (ref 65–99)
Potassium: 4.2 mmol/L (ref 3.5–5.2)
SODIUM: 139 mmol/L (ref 134–144)
TOTAL PROTEIN: 6.5 g/dL (ref 6.0–8.5)

## 2018-08-30 LAB — LIPID PANEL
CHOL/HDL RATIO: 5.6 ratio — AB (ref 0.0–5.0)
Cholesterol, Total: 223 mg/dL — ABNORMAL HIGH (ref 100–199)
HDL: 40 mg/dL (ref >39)
LDL Calculated: 150 mg/dL — ABNORMAL HIGH (ref 0–99)
Triglycerides: 163 mg/dL — ABNORMAL HIGH (ref 0–149)
VLDL Cholesterol Cal: 33 mg/dL (ref 5–40)

## 2018-08-30 LAB — PSA: PROSTATE SPECIFIC AG, SERUM: 0.5 ng/mL (ref 0.0–4.0)

## 2018-09-05 ENCOUNTER — Other Ambulatory Visit: Payer: Self-pay

## 2018-09-05 DIAGNOSIS — R062 Wheezing: Secondary | ICD-10-CM

## 2018-09-05 MED ORDER — ALBUTEROL SULFATE HFA 108 (90 BASE) MCG/ACT IN AERS: 2.0000 | INHALATION_SPRAY | Freq: Four times a day (QID) | RESPIRATORY_TRACT | 0 refills | Status: DC | PRN

## 2018-09-24 ENCOUNTER — Encounter: Payer: Self-pay | Admitting: *Deleted

## 2018-10-03 ENCOUNTER — Other Ambulatory Visit: Payer: Self-pay

## 2018-10-03 DIAGNOSIS — Z1211 Encounter for screening for malignant neoplasm of colon: Secondary | ICD-10-CM

## 2018-10-13 ENCOUNTER — Telehealth: Payer: Self-pay

## 2018-10-15 NOTE — Telephone Encounter (Signed)
error 

## 2018-10-30 NOTE — Discharge Instructions (Signed)
General Anesthesia, Adult, Care After °These instructions provide you with information about caring for yourself after your procedure. Your health care provider may also give you more specific instructions. Your treatment has been planned according to current medical practices, but problems sometimes occur. Call your health care provider if you have any problems or questions after your procedure. °What can I expect after the procedure? °After the procedure, it is common to have: °· Vomiting. °· A sore throat. °· Mental slowness. ° °It is common to feel: °· Nauseous. °· Cold or shivery. °· Sleepy. °· Tired. °· Sore or achy, even in parts of your body where you did not have surgery. ° °Follow these instructions at home: °For at least 24 hours after the procedure: °· Do not: °? Participate in activities where you could fall or become injured. °? Drive. °? Use heavy machinery. °? Drink alcohol. °? Take sleeping pills or medicines that cause drowsiness. °? Make important decisions or sign legal documents. °? Take care of children on your own. °· Rest. °Eating and drinking °· If you vomit, drink water, juice, or soup when you can drink without vomiting. °· Drink enough fluid to keep your urine clear or pale yellow. °· Make sure you have little or no nausea before eating solid foods. °· Follow the diet recommended by your health care provider. °General instructions °· Have a responsible adult stay with you until you are awake and alert. °· Return to your normal activities as told by your health care provider. Ask your health care provider what activities are safe for you. °· Take over-the-counter and prescription medicines only as told by your health care provider. °· If you smoke, do not smoke without supervision. °· Keep all follow-up visits as told by your health care provider. This is important. °Contact a health care provider if: °· You continue to have nausea or vomiting at home, and medicines are not helpful. °· You  cannot drink fluids or start eating again. °· You cannot urinate after 8-12 hours. °· You develop a skin rash. °· You have fever. °· You have increasing redness at the site of your procedure. °Get help right away if: °· You have difficulty breathing. °· You have chest pain. °· You have unexpected bleeding. °· You feel that you are having a life-threatening or urgent problem. °This information is not intended to replace advice given to you by your health care provider. Make sure you discuss any questions you have with your health care provider. °Document Released: 03/11/2001 Document Revised: 05/07/2016 Document Reviewed: 11/17/2015 °Elsevier Interactive Patient Education © 2018 [employer]vier Inc. ° °

## 2018-10-31 ENCOUNTER — Ambulatory Visit
Admission: RE | Admit: 2018-10-31 | Discharge: 2018-10-31 | Disposition: A | Discharge status: Home / Self Care | Payer: BLUE CROSS/BLUE SHIELD | Source: Ambulatory Visit | Attending: Gastroenterology | Admitting: Gastroenterology

## 2018-10-31 ENCOUNTER — Ambulatory Visit: Payer: BLUE CROSS/BLUE SHIELD | Admitting: Anesthesiology

## 2018-10-31 ENCOUNTER — Encounter
Admission: RE | Disposition: A | Discharge status: Home / Self Care | Payer: Self-pay | Source: Ambulatory Visit | Attending: Gastroenterology

## 2018-10-31 DIAGNOSIS — I10 Essential (primary) hypertension: Secondary | ICD-10-CM | POA: Diagnosis not present

## 2018-10-31 DIAGNOSIS — D122 Benign neoplasm of ascending colon: Secondary | ICD-10-CM | POA: Diagnosis not present

## 2018-10-31 DIAGNOSIS — R12 Heartburn: Secondary | ICD-10-CM | POA: Insufficient documentation

## 2018-10-31 DIAGNOSIS — Z79899 Other long term (current) drug therapy: Secondary | ICD-10-CM | POA: Insufficient documentation

## 2018-10-31 DIAGNOSIS — Z87891 Personal history of nicotine dependence: Secondary | ICD-10-CM | POA: Insufficient documentation

## 2018-10-31 DIAGNOSIS — Z1211 Encounter for screening for malignant neoplasm of colon: Secondary | ICD-10-CM

## 2018-10-31 DIAGNOSIS — N400 Enlarged prostate without lower urinary tract symptoms: Secondary | ICD-10-CM | POA: Diagnosis not present

## 2018-10-31 DIAGNOSIS — D127 Benign neoplasm of rectosigmoid junction: Secondary | ICD-10-CM | POA: Diagnosis not present

## 2018-10-31 HISTORY — DX: Essential (primary) hypertension: I10

## 2018-10-31 HISTORY — DX: Benign prostatic hyperplasia without lower urinary tract symptoms: N40.0

## 2018-10-31 HISTORY — DX: Wheezing: R06.2

## 2018-10-31 HISTORY — PX: COLONOSCOPY WITH PROPOFOL: SHX5780

## 2018-10-31 HISTORY — PX: POLYPECTOMY: SHX5525

## 2018-10-31 HISTORY — DX: Heartburn: R12

## 2018-10-31 SURGERY — COLONOSCOPY WITH PROPOFOL
Anesthesia: General | Site: Rectum

## 2018-10-31 MED ORDER — LACTATED RINGERS IV SOLN
10.0000 mL/h | INTRAVENOUS | Status: DC
  Administered 2018-10-31: 10 mL/h via INTRAVENOUS

## 2018-10-31 MED ORDER — STERILE WATER FOR IRRIGATION IR SOLN
Status: DC | PRN
  Administered 2018-10-31: 08:00:00

## 2018-10-31 MED ORDER — LIDOCAINE HCL (CARDIAC) PF 100 MG/5ML IV SOSY
PREFILLED_SYRINGE | INTRAVENOUS | Status: DC | PRN
  Administered 2018-10-31: 40 mg via INTRAVENOUS

## 2018-10-31 MED ORDER — SODIUM CHLORIDE 0.9 % IV SOLN: INTRAVENOUS | Status: DC

## 2018-10-31 MED ORDER — ONDANSETRON HCL 4 MG/2ML IJ SOLN: 4.0000 mg | Freq: Once | INTRAMUSCULAR | Status: DC | PRN

## 2018-10-31 MED ORDER — PROPOFOL 10 MG/ML IV BOLUS
INTRAVENOUS | Status: DC | PRN
  Administered 2018-10-31: 20 mg via INTRAVENOUS
  Administered 2018-10-31: 30 mg via INTRAVENOUS
  Administered 2018-10-31: 40 mg via INTRAVENOUS
  Administered 2018-10-31 (×2): 30 mg via INTRAVENOUS
  Administered 2018-10-31: 20 mg via INTRAVENOUS
  Administered 2018-10-31 (×4): 30 mg via INTRAVENOUS
  Administered 2018-10-31: 100 mg via INTRAVENOUS

## 2018-10-31 SURGICAL SUPPLY — 8 items
CANISTER SUCT 1200ML W/VALVE (MISCELLANEOUS) ×2 IMPLANT
FORCEPS BIOP RAD 4 LRG CAP 4 (CUTTING FORCEPS) ×2 IMPLANT
GOWN CVR UNV OPN BCK APRN NK (MISCELLANEOUS) ×2 IMPLANT
GOWN ISOL THUMB LOOP REG UNIV (MISCELLANEOUS) ×2
KIT ENDO PROCEDURE OLY (KITS) ×2 IMPLANT
SNARE SHORT THROW 13M SML OVAL (MISCELLANEOUS) ×2 IMPLANT
TRAP ETRAP POLY (MISCELLANEOUS) ×2 IMPLANT
WATER STERILE IRR 250ML POUR (IV SOLUTION) ×2 IMPLANT

## 2018-10-31 NOTE — H&P (Signed)
Lucilla Lame, MD [facility] [address] [place], [place] [address] Phone: [phone] Fax : [phone]  Primary Care Physician:  Glean Hess, MD Primary Gastroenterologist:  Dr. Allen Norris  Pre-Procedure History & Physical: HPI:  MACALLAN ORD is a 52 y.o. male is here for a screening colonoscopy.   Past Medical History:  Diagnosis Date  . BPH (benign prostatic hyperplasia)   . Heartburn   . Hypertension   . Wheezing     Past Surgical History:  Procedure Laterality Date  . APPENDECTOMY    . CATARACT EXTRACTION, BILATERAL  2019  . FRACTURE SURGERY    . HEMORRHOID SURGERY      Prior to Admission medications   Medication Sig Start Date End Date Taking? Authorizing Provider  albuterol (PROVENTIL HFA;VENTOLIN HFA) 108 (90 Base) MCG/ACT inhaler Inhale 2 puffs into the lungs every 6 (six) hours as needed for wheezing or shortness of breath. 09/05/18  Yes Glean Hess, MD  atorvastatin (LIPITOR) 10 MG tablet Take 1 tablet (10 mg total) by mouth daily. 08/29/18  Yes Glean Hess, MD  losartan (COZAAR) 50 MG tablet Take 1 tablet (50 mg total) by mouth daily. 08/29/18  Yes Glean Hess, MD  omeprazole (PRILOSEC) 10 MG capsule Take 10 mg by mouth daily.    [provider]    Allergies as of 10/03/2018  . (No Known Allergies)    Family History  Problem Relation Age of Onset  . Hypertension Father   . CAD Father 24  . AAA (abdominal aortic aneurysm) Father     Social History   Socioeconomic History  . Marital status: Married    Spouse name: Not on file  . Number of children: Not on file  . Years of education: Not on file  . Highest education level: Not on file  Occupational History  . Not on file  Social Needs  . Financial resource strain: Not on file  . Food insecurity:    Worry: Not on file    Inability: Not on file  . Transportation needs:    Medical: Not on file    Non-medical: Not on file  Tobacco Use  . Smoking status:  Former Smoker    Packs/day: 2.00    Years: 25.00    Pack years: 50.00    Types: Cigarettes    Last attempt to quit: 01/18/2011    Years since quitting: 7.7  . Smokeless tobacco: Never Used  Substance and Sexual Activity  . Alcohol use: No    Alcohol/week: 0.0 standard drinks  . Drug use: No  . Sexual activity: Not on file  Lifestyle  . Physical activity:    Days per week: Not on file    Minutes per session: Not on file  . Stress: Not on file  Relationships  . Social connections:    Talks on phone: Not on file    Gets together: Not on file    Attends religious service: Not on file    Active member of club or organization: Not on file    Attends meetings of clubs or organizations: Not on file    Relationship status: Not on file  . Intimate partner violence:    Fear of current or ex partner: Not on file    Emotionally abused: Not on file    Physically abused: Not on file    Forced sexual activity: Not on file  Other Topics Concern  . Not on file  Social History  Narrative  . Not on file    Review of Systems: See HPI, otherwise negative ROS  Physical Exam: BP (!) 139/94   Pulse 69   Temp 97.7 F (36.5 C) (Temporal)   Resp 16   Ht 6' (1.829 m)   Wt 89.8 kg   SpO2 99%   BMI 26.85 kg/m  General:   Alert,  pleasant and cooperative in NAD Head:  Normocephalic and atraumatic. Neck:  Supple; no masses or thyromegaly. Lungs:  Clear throughout to auscultation.    Heart:  Regular rate and rhythm. Abdomen:  Soft, nontender and nondistended. Normal bowel sounds, without guarding, and without rebound.   Neurologic:  Alert and  oriented x4;  grossly normal neurologically.  Impression/Plan: CELIA GIBBONS is now here to undergo a screening colonoscopy.  Risks, benefits, and alternatives regarding colonoscopy have been reviewed with the patient.  Questions have been answered.  All parties agreeable.

## 2018-10-31 NOTE — Anesthesia Procedure Notes (Signed)
Date/Time: 10/31/2018 8:17 AM Performed by: Janna Arch, CRNA Pre-anesthesia Checklist: Patient identified, Emergency Drugs available, Suction available, Timeout performed and Patient being monitored Patient Re-evaluated:Patient Re-evaluated prior to induction Oxygen Delivery Method: Nasal cannula Placement Confirmation: positive ETCO2

## 2018-10-31 NOTE — Anesthesia Preprocedure Evaluation (Signed)
Anesthesia Evaluation  Patient identified by MRN, date of birth, ID band Patient awake    Reviewed: Allergy & Precautions, NPO status , Patient's Chart, lab work & pertinent test results  Airway Mallampati: II  TM Distance: >3 FB     Dental   Pulmonary former smoker,    breath sounds clear to auscultation       Cardiovascular hypertension,  Rhythm:Regular Rate:Normal  HLD   Neuro/Psych    GI/Hepatic GERD  ,  Endo/Other    Renal/GU      Musculoskeletal   Abdominal   Peds  Hematology   Anesthesia Other Findings   Reproductive/Obstetrics                             Anesthesia Physical Anesthesia Plan  ASA: II  Anesthesia Plan: General   Post-op Pain Management:    Induction: Intravenous  PONV Risk Score and Plan: TIVA  Airway Management Planned: Nasal Cannula and Natural Airway  Additional Equipment:   Intra-op Plan:   Post-operative Plan:   Informed Consent: I have reviewed the patients History and Physical, chart, labs and discussed the procedure including the risks, benefits and alternatives for the proposed anesthesia with the patient or authorized representative who has indicated his/her understanding and acceptance.     Plan Discussed with: CRNA  Anesthesia Plan Comments:         Anesthesia Quick Evaluation

## 2018-10-31 NOTE — Op Note (Signed)
[facility] Patient Name: Lawrence Vega Procedure Date: 10/31/2018 8:14 AM MRN: [id number] Account #: [id number] Date of Birth: 01-Sep-1966 Admit Type: Outpatient Age: 52 Room: [facility] OR ROOM 01 Gender: Male Note Status: Finalized Procedure:            Colonoscopy Indications:          Screening for colorectal malignant neoplasm Providers:            Lucilla Lame MD, MD Referring MD:         Halina Maidens, MD (Referring MD) Medicines:            Propofol per Anesthesia Complications:        No immediate complications. Procedure:            Pre-Anesthesia Assessment:                       - Prior to the procedure, a History and Physical was                        performed, and patient medications and allergies were                        reviewed. The patient's tolerance of previous                        anesthesia was also reviewed. The risks and benefits of                        the procedure and the sedation options and risks were                        discussed with the patient. All questions were                        answered, and informed consent was obtained. Prior                        Anticoagulants: The patient has taken no previous                        anticoagulant or antiplatelet agents. ASA Grade                        Assessment: II - A patient with mild systemic disease.                        After reviewing the risks and benefits, the patient was                        deemed in satisfactory condition to undergo the                        procedure.                       After obtaining informed consent, the colonoscope was                        passed under direct vision. Throughout the procedure,  the patient's blood pressure, pulse, and oxygen                        saturations were monitored continuously. The                        Colonoscope was introduced through the anus and                         advanced to the the cecum, identified by appendiceal                        orifice and ileocecal valve. The colonoscopy was                        performed without difficulty. The patient tolerated the                        procedure well. The quality of the bowel preparation                        was excellent. Findings:      The perianal and digital rectal examinations were normal.      Two sessile polyps were found in the ascending colon. The polyps were 6       to 8 mm in size. These polyps were removed with a cold snare. Resection       and retrieval were complete.      Three sessile polyps were found in the recto-sigmoid colon. The polyps       were 2 to 6 mm in size. These polyps were removed with a cold snare.       Resection and retrieval were complete. Impression:           - Two 6 to 8 mm polyps in the ascending colon, removed                        with a cold snare. Resected and retrieved.                       - Three 2 to 6 mm polyps at the recto-sigmoid colon,                        removed with a cold snare. Resected and retrieved. Recommendation:       - Discharge patient to home.                       - Resume previous diet.                       - Continue present medications.                       - Await pathology results.                       - Repeat colonoscopy in 5 years if polyp adenoma and 10                        years if hyperplastic Procedure Code(s):    --- Professional ---  45385, Colonoscopy, flexible; with removal of tumor(s),                        polyp(s), or other lesion(s) by snare technique                       45380, 59, Colonoscopy, flexible; with biopsy, single                        or multiple Diagnosis Code(s):    --- Professional ---                       Z12.11, Encounter for screening for malignant neoplasm                        of colon                       D12.2, Benign neoplasm of ascending  colon                       D12.7, Benign neoplasm of rectosigmoid junction CPT copyright 2018 American Medical Association. All rights reserved. The codes documented in this report are preliminary and upon coder review may  be revised to meet current compliance requirements. Lucilla Lame MD, MD 10/31/2018 8:43:11 AM This report has been signed electronically. Number of Addenda: 0 Note Initiated On: 10/31/2018 8:14 AM Scope Withdrawal Time: 0 hours 15 minutes 2 seconds  Total Procedure Duration: 0 hours 18 minutes 33 seconds       [facility]

## 2018-10-31 NOTE — Transfer of Care (Signed)
Immediate Anesthesia Transfer of Care Note  Patient: Lawrence Vega  Procedure(s) Performed: COLONOSCOPY WITH BIOPSIES (N/A Rectum) POLYPECTOMY (N/A Rectum)  Patient Location: PACU  Anesthesia Type: General  Level of Consciousness: awake, alert  and patient cooperative  Airway and Oxygen Therapy: Patient Spontanous Breathing and Patient connected to supplemental oxygen  Post-op Assessment: Post-op Vital signs reviewed, Patient's Cardiovascular Status Stable, Respiratory Function Stable, Patent Airway and No signs of Nausea or vomiting  Post-op Vital Signs: Reviewed and stable  Complications: No apparent anesthesia complications

## 2018-10-31 NOTE — Anesthesia Postprocedure Evaluation (Signed)
Anesthesia Post Note  Patient: Lawrence Vega  Procedure(s) Performed: COLONOSCOPY WITH BIOPSIES (N/A Rectum) POLYPECTOMY (N/A Rectum)  Patient location during evaluation: PACU Anesthesia Type: General Level of consciousness: awake Pain management: pain level controlled Vital Signs Assessment: post-procedure vital signs reviewed and stable Respiratory status: respiratory function stable Cardiovascular status: stable Postop Assessment: no signs of nausea or vomiting Anesthetic complications: no    Veda Canning

## 2018-11-03 ENCOUNTER — Encounter: Payer: Self-pay | Admitting: Gastroenterology

## 2018-11-06 ENCOUNTER — Encounter: Payer: Self-pay | Admitting: Gastroenterology

## 2018-12-26 ENCOUNTER — Ambulatory Visit: Payer: Self-pay | Admitting: Cardiovascular Disease

## 2018-12-26 ENCOUNTER — Encounter

## 2019-04-10 ENCOUNTER — Other Ambulatory Visit: Payer: Self-pay | Admitting: Internal Medicine

## 2019-04-10 DIAGNOSIS — E782 Mixed hyperlipidemia: Secondary | ICD-10-CM

## 2019-04-10 DIAGNOSIS — I10 Essential (primary) hypertension: Secondary | ICD-10-CM

## 2019-08-07 ENCOUNTER — Other Ambulatory Visit: Payer: Self-pay

## 2019-08-07 ENCOUNTER — Ambulatory Visit (INDEPENDENT_AMBULATORY_CARE_PROVIDER_SITE_OTHER): Payer: BC Managed Care – PPO | Admitting: Internal Medicine

## 2019-08-07 ENCOUNTER — Encounter: Payer: Self-pay | Admitting: Internal Medicine

## 2019-08-07 VITALS — BP 118/77 | HR 62 | Ht 72.0 in | Wt 196.0 lb

## 2019-08-07 DIAGNOSIS — I10 Essential (primary) hypertension: Secondary | ICD-10-CM

## 2019-08-07 DIAGNOSIS — N400 Enlarged prostate without lower urinary tract symptoms: Secondary | ICD-10-CM

## 2019-08-07 DIAGNOSIS — Z0000 Encounter for general adult medical examination without abnormal findings: Secondary | ICD-10-CM | POA: Diagnosis not present

## 2019-08-07 DIAGNOSIS — E782 Mixed hyperlipidemia: Secondary | ICD-10-CM | POA: Diagnosis not present

## 2019-08-07 DIAGNOSIS — Z23 Encounter for immunization: Secondary | ICD-10-CM

## 2019-08-07 DIAGNOSIS — M67442 Ganglion, left hand: Secondary | ICD-10-CM | POA: Diagnosis not present

## 2019-08-07 LAB — POCT URINALYSIS DIPSTICK
Bilirubin, UA: NEGATIVE
Blood, UA: NEGATIVE
Glucose, UA: NEGATIVE
Ketones, UA: NEGATIVE
Leukocytes, UA: NEGATIVE
Nitrite, UA: NEGATIVE
Protein, UA: NEGATIVE
Spec Grav, UA: 1.01 (ref 1.010–1.025)
Urobilinogen, UA: 0.2 E.U./dL
pH, UA: 6.5 (ref 5.0–8.0)

## 2019-08-07 MED ORDER — ATORVASTATIN CALCIUM 10 MG PO TABS: 10.0000 mg | ORAL_TABLET | Freq: Every day | ORAL | 3 refills | Status: DC

## 2019-08-07 MED ORDER — LOSARTAN POTASSIUM 50 MG PO TABS: 50.0000 mg | ORAL_TABLET | Freq: Every day | ORAL | 3 refills | Status: DC

## 2019-08-07 NOTE — Progress Notes (Signed)
Date:  08/07/2019   Name:  Lawrence Vega   DOB:  Nov 23, 1966   MRN:  [id number]   Chief Complaint: Annual Exam and Immunizations (Flu shot.) Lawrence Vega is a 53 y.o. male who presents today for his Complete Annual Exam. He feels well. He reports exercising walking. He reports he is sleeping well.   Colonoscopy 2019  Hypertension This is a chronic problem. The problem is unchanged. The problem is controlled (128/80 at home). Pertinent negatives include no chest pain, headaches, palpitations, peripheral edema or shortness of breath. There are no associated agents to hypertension. Past treatments include angiotensin blockers. The current treatment provides no improvement.  Hyperlipidemia The problem is controlled. Pertinent negatives include no chest pain, myalgias or shortness of breath. Current antihyperlipidemic treatment includes statins. The current treatment provides significant improvement of lipids.   Lab Results  Component Value Date   CREATININE 0.99 08/29/2018   BUN 12 08/29/2018   NA 139 08/29/2018   K 4.2 08/29/2018   CL 100 08/29/2018   CO2 22 08/29/2018   Lab Results  Component Value Date   CHOL 223 (H) 08/29/2018   HDL 40 08/29/2018   LDLCALC 150 (H) 08/29/2018   TRIG 163 (H) 08/29/2018   CHOLHDL 5.6 (H) 08/29/2018     Review of Systems  Constitutional: Negative for appetite change, chills, diaphoresis, fatigue and unexpected weight change.  HENT: Negative for hearing loss, tinnitus, trouble swallowing and voice change.   Eyes: Negative for visual disturbance.  Respiratory: Negative for choking, shortness of breath and wheezing.   Cardiovascular: Negative for chest pain, palpitations and leg swelling.  Gastrointestinal: Negative for abdominal pain, blood in stool, constipation and diarrhea.  Genitourinary: Negative for difficulty urinating, dysuria and frequency.  Musculoskeletal: Negative for arthralgias, back pain and myalgias.  Skin: Negative for  color change and rash.  Neurological: Negative for dizziness, syncope and headaches.  Hematological: Negative for adenopathy.  Psychiatric/Behavioral: Negative for dysphoric mood and sleep disturbance.    Patient Active Problem List   Diagnosis Date Noted  . Benign neoplasm of ascending colon   . Benign neoplasm of rectosigmoid junction   . 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 Benign prostatic hyperplasia 09/23/2015  . 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 Hyperlipidemia 09/23/2015  . Tobacco use disorder, mild, in sustained remission 09/23/2015  . Hypertension 08/31/2011    No Known Allergies  Past Surgical History:  Procedure Laterality Date  . APPENDECTOMY    . CATARACT EXTRACTION, BILATERAL  2019  . COLONOSCOPY WITH PROPOFOL N/A 10/31/2018   Procedure: COLONOSCOPY WITH BIOPSIES;  Surgeon: Lucilla Lame, MD;  Location: [place];  Service: Endoscopy;  Laterality: N/A;  . FRACTURE SURGERY    . HEMORRHOID SURGERY    . POLYPECTOMY N/A 10/31/2018   Procedure: POLYPECTOMY;  Surgeon: Lucilla Lame, MD;  Location: [place];  Service: Endoscopy;  Laterality: N/A;    Social History   Tobacco Use  . Smoking status: Former Smoker    Packs/day: 2.00    Years: 25.00    Pack years: 50.00    Types: Cigarettes    Quit date: 01/18/2011    Years since quitting: 8.5  . Smokeless tobacco: Never Used  Substance Use Topics  . Alcohol use: No    Alcohol/week: 0.0 standard drinks  . Drug use: No     Medication list has been reviewed and updated.  Current Meds  Medication Sig  . albuterol (PROVENTIL HFA;VENTOLIN HFA) 108 (90 Base)  MCG/ACT inhaler Inhale 2 puffs into the lungs every 6 (six) hours as needed for wheezing or shortness of breath.  Marland Kitchen atorvastatin (LIPITOR) 10 MG tablet Take 1 tablet by mouth once daily  . losartan (COZAAR) 50 MG tablet Take 1 tablet by mouth once daily    PHQ 2/9 Scores 08/07/2019 08/29/2018 09/06/2017  PHQ - 2 Score 0  0 0    BP Readings from Last 3 Encounters:  08/07/19 118/77  10/31/18 112/77  08/29/18 112/80    Physical Exam Vitals signs and nursing note reviewed.  Constitutional:      Appearance: Normal appearance. He is well-developed.  HENT:     Head: Normocephalic.     Right Ear: Tympanic membrane, ear canal and external ear normal.     Left Ear: Tympanic membrane, ear canal and external ear normal.     Nose: Nose normal.  Eyes:     Conjunctiva/sclera: Conjunctivae normal.     Pupils: Pupils are equal, round, and reactive to light.  Neck:     Musculoskeletal: Normal range of motion and neck supple.     Thyroid: No thyromegaly.     Vascular: No carotid bruit.  Cardiovascular:     Rate and Rhythm: Normal rate and regular rhythm.     Heart sounds: Normal heart sounds.  Pulmonary:     Effort: Pulmonary effort is normal.     Breath sounds: Normal breath sounds. No wheezing.  Chest:     Breasts:        Right: No mass.        Left: No mass.  Abdominal:     General: Bowel sounds are normal.     Palpations: Abdomen is soft.     Tenderness: There is no abdominal tenderness.  Musculoskeletal: Normal range of motion.     Right lower leg: No edema.     Left lower leg: No edema.     Comments: Green pea sized mucous cyst on distal left 5th finger  Feet:     Comments: Foot exam normal - no forefore tenderness or swelling; no plantar facial pain or inflammation Pulses normal Lymphadenopathy:     Cervical: No cervical adenopathy.  Skin:    General: Skin is warm and dry.     Capillary Refill: Capillary refill takes less than 2 seconds.  Neurological:     General: No focal deficit present.     Mental Status: He is alert and oriented to person, place, and time.     Deep Tendon Reflexes: Reflexes are normal and symmetric.  Psychiatric:        Speech: Speech normal.        Behavior: Behavior normal.        Thought Content: Thought content normal.        Judgment: Judgment normal.      Wt Readings from Last 3 Encounters:  08/07/19 196 lb (88.9 kg)  10/31/18 198 lb (89.8 kg)  08/29/18 204 lb (92.5 kg)    BP 118/77   Pulse 62   Ht 6' (1.829 m)   Wt 196 lb (88.9 kg)   SpO2 99%   BMI 26.58 kg/m   Assessment and Plan: 1. Annual physical exam Normal exam Continue exercise and healthy diet - POCT urinalysis dipstick  2. Essential hypertension Clinically stable exam with well controlled BP.   Tolerating medications, cozaar 50 mg daily, without side effects at this time. Pt to continue current regimen and low sodium diet; benefits of regular exercise  discussed. - CBC with Differential/Platelet - Comprehensive metabolic panel - losartan (COZAAR) 50 MG tablet; Take 1 tablet (50 mg total) by mouth daily.  Dispense: 90 tablet; Refill: 3  3. Mixed hyperlipidemia Tolerating statin medication without side effects at this time LDL is not at goal of < 130 on current dose Hopefully it has improved with diet change and weight loss Continue same therapy for now - Lipid panel - atorvastatin (LIPITOR) 10 MG tablet; Take 1 tablet (10 mg total) by mouth daily.  Dispense: 90 tablet; Refill: 3  4. Benign prostatic hyperplasia, unspecified whether lower urinary tract symptoms present Pt is not bothered by symptoms which have not progresses - PSA  5. Need for influenza vaccination - Flu Vaccine QUAD 36+ mos IM  6. Mucous cyst of digit of left hand Cyst enlarging but still asx A smaller cyst may be starting on his middle finger and seems to be causing some nail deformity - will refer to Ortho hand surgery - Ambulatory referral to Orthopedic Surgery   Partially dictated using Dragon software. Any errors are unintentional.  Halina Maidens, MD [facility]  08/07/2019

## 2019-08-07 NOTE — Patient Instructions (Signed)

## 2019-08-08 LAB — CBC WITH DIFFERENTIAL/PLATELET
Basophils Absolute: 0 10*3/uL (ref 0.0–0.2)
Basos: 0 %
EOS (ABSOLUTE): 0.1 10*3/uL (ref 0.0–0.4)
Eos: 2 %
Hematocrit: 48.7 % (ref 37.5–51.0)
Hemoglobin: 16.2 g/dL (ref 13.0–17.7)
Immature Grans (Abs): 0 10*3/uL (ref 0.0–0.1)
Immature Granulocytes: 0 %
Lymphocytes Absolute: 1.7 10*3/uL (ref 0.7–3.1)
Lymphs: 26 %
MCH: 28.6 pg (ref 26.6–33.0)
MCHC: 33.3 g/dL (ref 31.5–35.7)
MCV: 86 fL (ref 79–97)
Monocytes Absolute: 0.5 10*3/uL (ref 0.1–0.9)
Monocytes: 8 %
Neutrophils Absolute: 4.3 10*3/uL (ref 1.4–7.0)
Neutrophils: 64 %
Platelets: 219 10*3/uL (ref 150–450)
RBC: 5.66 x10E6/uL (ref 4.14–5.80)
RDW: 12.9 % (ref 11.6–15.4)
WBC: 6.7 10*3/uL (ref 3.4–10.8)

## 2019-08-08 LAB — COMPREHENSIVE METABOLIC PANEL
ALT: 16 IU/L (ref 0–44)
AST: 17 IU/L (ref 0–40)
Albumin/Globulin Ratio: 2.1 (ref 1.2–2.2)
Albumin: 4.5 g/dL (ref 3.8–4.9)
Alkaline Phosphatase: 89 IU/L (ref 39–117)
BUN/Creatinine Ratio: 9 (ref 9–20)
BUN: 8 mg/dL (ref 6–24)
Bilirubin Total: 0.8 mg/dL (ref 0.0–1.2)
CO2: 24 mmol/L (ref 20–29)
Calcium: 9.2 mg/dL (ref 8.7–10.2)
Chloride: 100 mmol/L (ref 96–106)
Creatinine, Ser: 0.86 mg/dL (ref 0.76–1.27)
GFR calc Af Amer: 116 mL/min/{1.73_m2} (ref >59)
GFR calc non Af Amer: 100 mL/min/{1.73_m2} (ref >59)
Globulin, Total: 2.1 g/dL (ref 1.5–4.5)
Glucose: 95 mg/dL (ref 65–99)
Potassium: 4.6 mmol/L (ref 3.5–5.2)
Sodium: 139 mmol/L (ref 134–144)
Total Protein: 6.6 g/dL (ref 6.0–8.5)

## 2019-08-08 LAB — PSA: Prostate Specific Ag, Serum: 0.5 ng/mL (ref 0.0–4.0)

## 2019-08-08 LAB — LIPID PANEL
Chol/HDL Ratio: 3.3 ratio (ref 0.0–5.0)
Cholesterol, Total: 161 mg/dL (ref 100–199)
HDL: 49 mg/dL (ref >39)
LDL Calculated: 80 mg/dL (ref 0–99)
Triglycerides: 159 mg/dL — ABNORMAL HIGH (ref 0–149)
VLDL Cholesterol Cal: 32 mg/dL (ref 5–40)

## 2019-09-29 ENCOUNTER — Other Ambulatory Visit: Payer: Self-pay

## 2019-09-29 DIAGNOSIS — R062 Wheezing: Secondary | ICD-10-CM

## 2019-09-29 MED ORDER — ALBUTEROL SULFATE HFA 108 (90 BASE) MCG/ACT IN AERS: 2.0000 | INHALATION_SPRAY | Freq: Four times a day (QID) | RESPIRATORY_TRACT | 5 refills | Status: DC | PRN

## 2019-12-17 ENCOUNTER — Ambulatory Visit: Payer: BC Managed Care – PPO | Attending: Internal Medicine

## 2019-12-17 DIAGNOSIS — Z20822 Contact with and (suspected) exposure to covid-19: Secondary | ICD-10-CM

## 2019-12-20 ENCOUNTER — Telehealth: Payer: Self-pay

## 2019-12-20 NOTE — Telephone Encounter (Signed)
Pt's wife called for covid results advised that results are not back.

## 2019-12-23 LAB — NOVEL CORONAVIRUS, NAA

## 2020-03-03 ENCOUNTER — Ambulatory Visit: Payer: BC Managed Care – PPO | Attending: Internal Medicine

## 2020-03-03 DIAGNOSIS — Z23 Encounter for immunization: Secondary | ICD-10-CM

## 2020-03-03 NOTE — Progress Notes (Signed)
   Covid-19 Vaccination Clinic  Name:  Lawrence Vega    MRN: [id number] DOB: Oct 02, 1966  03/03/2020  [name] was observed post Covid-19 immunization for 15 minutes without incident. He was provided with Vaccine Information Sheet and instruction to access the V-Safe system.   [name] was instructed to call 911 with any severe reactions post vaccine: Marland Kitchen Difficulty breathing  . Swelling of face and throat  . A fast heartbeat  . A bad rash all over body  . Dizziness and weakness   Immunizations Administered    Name Date Dose VIS Date Route   Pfizer COVID-19 Vaccine 03/03/2020 11:39 AM 0.3 mL 11/27/2019 Intramuscular   Manufacturer: [place]   Lot: [id number]   [place]: [id number]

## 2020-03-29 ENCOUNTER — Ambulatory Visit: Payer: BC Managed Care – PPO | Attending: Internal Medicine

## 2020-03-29 DIAGNOSIS — Z23 Encounter for immunization: Secondary | ICD-10-CM

## 2020-03-29 NOTE — Progress Notes (Signed)
   Covid-19 Vaccination Clinic  Name:  Lawrence Vega    MRN: [id number] DOB: 1966/07/02  03/29/2020  [name] was observed post Covid-19 immunization for 15 minutes without incident. He was provided with Vaccine Information Sheet and instruction to access the V-Safe system.   [name] was instructed to call 911 with any severe reactions post vaccine: Marland Kitchen Difficulty breathing  . Swelling of face and throat  . A fast heartbeat  . A bad rash all over body  . Dizziness and weakness   Immunizations Administered    Name Date Dose VIS Date Route   Pfizer COVID-19 Vaccine 03/29/2020  8:10 AM 0.3 mL 11/27/2019 Intramuscular   Manufacturer: [place]   Lot: [id number]   [place]: [id number]

## 2020-08-12 ENCOUNTER — Ambulatory Visit (INDEPENDENT_AMBULATORY_CARE_PROVIDER_SITE_OTHER): Payer: BC Managed Care – PPO | Admitting: Internal Medicine

## 2020-08-12 ENCOUNTER — Other Ambulatory Visit: Payer: Self-pay

## 2020-08-12 ENCOUNTER — Encounter: Payer: Self-pay | Admitting: Internal Medicine

## 2020-08-12 VITALS — BP 122/78 | HR 62 | Ht 72.0 in | Wt 200.0 lb

## 2020-08-12 DIAGNOSIS — E782 Mixed hyperlipidemia: Secondary | ICD-10-CM | POA: Diagnosis not present

## 2020-08-12 DIAGNOSIS — Z1159 Encounter for screening for other viral diseases: Secondary | ICD-10-CM | POA: Diagnosis not present

## 2020-08-12 DIAGNOSIS — I10 Essential (primary) hypertension: Secondary | ICD-10-CM | POA: Diagnosis not present

## 2020-08-12 DIAGNOSIS — Z23 Encounter for immunization: Secondary | ICD-10-CM | POA: Diagnosis not present

## 2020-08-12 DIAGNOSIS — Z0000 Encounter for general adult medical examination without abnormal findings: Secondary | ICD-10-CM | POA: Diagnosis not present

## 2020-08-12 DIAGNOSIS — Z125 Encounter for screening for malignant neoplasm of prostate: Secondary | ICD-10-CM

## 2020-08-12 LAB — POCT URINALYSIS DIPSTICK
Bilirubin, UA: NEGATIVE
Blood, UA: NEGATIVE
Glucose, UA: NEGATIVE
Ketones, UA: NEGATIVE
Leukocytes, UA: NEGATIVE
Nitrite, UA: NEGATIVE
Protein, UA: NEGATIVE
Spec Grav, UA: 1.015 (ref 1.010–1.025)
Urobilinogen, UA: 0.2 E.U./dL
pH, UA: 6 (ref 5.0–8.0)

## 2020-08-12 NOTE — Progress Notes (Signed)
Date:  08/12/2020   Name:  Lawrence Vega   DOB:  01/18/1966   MRN:  [id number]   Chief Complaint: Annual Exam (Flu shot.)  Lawrence Vega is a 54 y.o. male who presents today for his Complete Annual Exam. He feels well. He reports exercising - none but has a physical job. He reports he is sleeping well.   Colonoscopy: 10/2018  Immunization History  Administered Date(s) Administered  . Influenza,inj,Quad PF,6+ Mos 09/30/2015, 08/29/2018, 08/07/2019  . Influenza-Unspecified 10/21/2017  . PFIZER SARS-COV-2 Vaccination 03/03/2020, 03/29/2020  . Tdap 09/30/2015    Hypertension This is a chronic problem. The problem is controlled. Pertinent negatives include no chest pain, headaches, palpitations or shortness of breath. Past treatments include angiotensin blockers. The current treatment provides significant improvement. There are no compliance problems.   Hyperlipidemia The problem is controlled. Pertinent negatives include no chest pain, myalgias or shortness of breath. Current antihyperlipidemic treatment includes statins. The current treatment provides significant improvement of lipids.  He is wondering if he really needs to take lipitor.  He has remained tobacco free.  He has no side effects but was told that statins have been linked to dementia which his father had.  Review of labs - LDL was 171 4 yrs ago and now, on low dose lipitor, is 80.  Lab Results  Component Value Date   CREATININE 0.86 08/07/2019   BUN 8 08/07/2019   NA 139 08/07/2019   K 4.6 08/07/2019   CL 100 08/07/2019   CO2 24 08/07/2019   Lab Results  Component Value Date   CHOL 161 08/07/2019   HDL 49 08/07/2019   LDLCALC 80 08/07/2019   TRIG 159 (H) 08/07/2019   CHOLHDL 3.3 08/07/2019   Lab Results  Component Value Date   TSH 0.559 09/30/2015   No results found for: HGBA1C Lab Results  Component Value Date   WBC 6.7 08/07/2019   HGB 16.2 08/07/2019   HCT 48.7 08/07/2019   MCV 86 08/07/2019     PLT 219 08/07/2019   Lab Results  Component Value Date   ALT 16 08/07/2019   AST 17 08/07/2019   ALKPHOS 89 08/07/2019   BILITOT 0.8 08/07/2019     Review of Systems  Constitutional: Negative for appetite change, chills, diaphoresis, fatigue and unexpected weight change.  HENT: Negative for hearing loss, tinnitus, trouble swallowing and voice change.   Eyes: Negative for visual disturbance.  Respiratory: Negative for choking, shortness of breath and wheezing.   Cardiovascular: Negative for chest pain, palpitations and leg swelling.  Gastrointestinal: Negative for abdominal pain, blood in stool, constipation and diarrhea.  Genitourinary: Negative for difficulty urinating, dysuria and frequency.  Musculoskeletal: Negative for arthralgias, back pain and myalgias.  Skin: Negative for color change and rash.  Neurological: Negative for dizziness, syncope and headaches.  Hematological: Negative for adenopathy.  Psychiatric/Behavioral: Negative for dysphoric mood and sleep disturbance.    Patient Active Problem List   Diagnosis Date Noted  . Benign neoplasm of ascending colon   . Benign neoplasm of rectosigmoid junction   . 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 Benign prostatic hyperplasia 09/23/2015  . 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 Hyperlipidemia 09/23/2015  . Tobacco use disorder, mild, in sustained remission 09/23/2015  . Hypertension 08/31/2011    No Known Allergies  Past Surgical History:  Procedure Laterality Date  . APPENDECTOMY    . CATARACT EXTRACTION, BILATERAL  2019  . COLONOSCOPY WITH PROPOFOL N/A 10/31/2018  Procedure: COLONOSCOPY WITH BIOPSIES;  Surgeon: Lucilla Lame, MD;  Location: [place];  Service: Endoscopy;  Laterality: N/A;  . FRACTURE SURGERY    . HEMORRHOID SURGERY    . POLYPECTOMY N/A 10/31/2018   Procedure: POLYPECTOMY;  Surgeon: Lucilla Lame, MD;  Location: [place];  Service: Endoscopy;  Laterality:  N/A;    Social History   Tobacco Use  . Smoking status: Former Smoker    Packs/day: 2.00    Years: 25.00    Pack years: 50.00    Types: Cigarettes    Quit date: 01/18/2011    Years since quitting: 9.5  . Smokeless tobacco: Never Used  Vaping Use  . Vaping Use: Never used  Substance Use Topics  . Alcohol use: No    Alcohol/week: 0.0 standard drinks  . Drug use: No     Medication list has been reviewed and updated.  Current Meds  Medication Sig  . albuterol (VENTOLIN HFA) 108 (90 Base) MCG/ACT inhaler Inhale 2 puffs into the lungs every 6 (six) hours as needed for wheezing or shortness of breath.  Marland Kitchen atorvastatin (LIPITOR) 10 MG tablet Take 1 tablet (10 mg total) by mouth daily.  Marland Kitchen losartan (COZAAR) 50 MG tablet Take 1 tablet (50 mg total) by mouth daily.    PHQ 2/9 Scores 08/12/2020 08/07/2019 08/29/2018 09/06/2017  PHQ - 2 Score 0 0 0 0  PHQ- 9 Score 0 - - -    GAD 7 : Generalized Anxiety Score 08/12/2020  Nervous, Anxious, on Edge 0  Control/stop worrying 0  Worry too much - different things 0  Trouble relaxing 0  Restless 0  Easily annoyed or irritable 0  Afraid - awful might happen 0  Total GAD 7 Score 0  Anxiety Difficulty Not difficult at all    BP Readings from Last 3 Encounters:  08/12/20 122/78  08/07/19 118/77  10/31/18 112/77    Physical Exam Vitals and nursing note reviewed.  Constitutional:      Appearance: Normal appearance. He is well-developed.  HENT:     Head: Normocephalic.     Right Ear: Tympanic membrane, ear canal and external ear normal.     Left Ear: Tympanic membrane, ear canal and external ear normal.     Nose: Nose normal.     Mouth/Throat:     Pharynx: Uvula midline.  Eyes:     Conjunctiva/sclera: Conjunctivae normal.     Pupils: Pupils are equal, round, and reactive to light.  Neck:     Thyroid: No thyromegaly.     Vascular: No carotid bruit.  Cardiovascular:     Rate and Rhythm: Normal rate and regular rhythm.     Heart  sounds: Normal heart sounds.  Pulmonary:     Effort: Pulmonary effort is normal.     Breath sounds: Normal breath sounds. No wheezing.  Chest:     Breasts:        Right: No mass.        Left: No mass.  Abdominal:     General: Bowel sounds are normal.     Palpations: Abdomen is soft.     Tenderness: There is no abdominal tenderness.  Musculoskeletal:        General: Normal range of motion.     Cervical back: Normal range of motion and neck supple.     Right lower leg: No edema.     Left lower leg: No edema.  Lymphadenopathy:     Cervical: No cervical adenopathy.  Skin:    General: Skin is warm and dry.     Capillary Refill: Capillary refill takes less than 2 seconds.  Neurological:     Mental Status: He is alert and oriented to person, place, and time.     Deep Tendon Reflexes: Reflexes are normal and symmetric.  Psychiatric:        Mood and Affect: Mood normal.        Speech: Speech normal.        Behavior: Behavior normal.     Wt Readings from Last 3 Encounters:  08/12/20 200 lb (90.7 kg)  08/07/19 196 lb (88.9 kg)  10/31/18 198 lb (89.8 kg)    BP 122/78   Pulse 62   Ht 6' (1.829 m)   Wt 200 lb (90.7 kg)   SpO2 97%   BMI 27.12 kg/m   Assessment and Plan: 1. Annual physical exam Normal exam Continue healthy diet and exercise. Discussed shingrix but pt states that he never had chickenpox - POCT urinalysis dipstick  2. Need for hepatitis C screening test - Hepatitis C antibody  3. Essential hypertension Clinically stable exam with well controlled BP on losartan. Tolerating medications without side effects at this time. Pt to continue current regimen and low sodium diet; benefits of regular exercise as able discussed. - CBC with Differential/Platelet - Comprehensive metabolic panel  4. Mixed hyperlipidemia Tolerating statin medication without side effects at this time He could consider stopping the statin if he desires or take 1/2 dose. Rational for  continuing discussed and he indicates that he will continue to take it for now. Continue same therapy without change at this time. - Lipid panel  5. Prostate cancer screening DRE deferred. - PSA   Partially dictated using Editor, commissioning. Any errors are unintentional.  Halina Maidens, MD [facility]  08/12/2020

## 2020-08-13 LAB — HEPATITIS C ANTIBODY: Hep C Virus Ab: 0.2 s/co ratio (ref 0.0–0.9)

## 2020-08-13 LAB — CBC WITH DIFFERENTIAL/PLATELET
Basophils Absolute: 0 10*3/uL (ref 0.0–0.2)
Basos: 1 %
EOS (ABSOLUTE): 0.2 10*3/uL (ref 0.0–0.4)
Eos: 2 %
Hematocrit: 47.9 % (ref 37.5–51.0)
Hemoglobin: 15.6 g/dL (ref 13.0–17.7)
Immature Grans (Abs): 0 10*3/uL (ref 0.0–0.1)
Immature Granulocytes: 0 %
Lymphocytes Absolute: 1.8 10*3/uL (ref 0.7–3.1)
Lymphs: 26 %
MCH: 28.2 pg (ref 26.6–33.0)
MCHC: 32.6 g/dL (ref 31.5–35.7)
MCV: 87 fL (ref 79–97)
Monocytes Absolute: 0.5 10*3/uL (ref 0.1–0.9)
Monocytes: 8 %
Neutrophils Absolute: 4.4 10*3/uL (ref 1.4–7.0)
Neutrophils: 63 %
Platelets: 207 10*3/uL (ref 150–450)
RBC: 5.54 x10E6/uL (ref 4.14–5.80)
RDW: 12.6 % (ref 11.6–15.4)
WBC: 6.9 10*3/uL (ref 3.4–10.8)

## 2020-08-13 LAB — COMPREHENSIVE METABOLIC PANEL
ALT: 15 IU/L (ref 0–44)
AST: 14 IU/L (ref 0–40)
Albumin/Globulin Ratio: 2 (ref 1.2–2.2)
Albumin: 4.4 g/dL (ref 3.8–4.9)
Alkaline Phosphatase: 90 IU/L (ref 48–121)
BUN/Creatinine Ratio: 13 (ref 9–20)
BUN: 11 mg/dL (ref 6–24)
Bilirubin Total: 0.9 mg/dL (ref 0.0–1.2)
CO2: 24 mmol/L (ref 20–29)
Calcium: 8.9 mg/dL (ref 8.7–10.2)
Chloride: 102 mmol/L (ref 96–106)
Creatinine, Ser: 0.85 mg/dL (ref 0.76–1.27)
GFR calc Af Amer: 116 mL/min/{1.73_m2} (ref >59)
GFR calc non Af Amer: 100 mL/min/{1.73_m2} (ref >59)
Globulin, Total: 2.2 g/dL (ref 1.5–4.5)
Glucose: 88 mg/dL (ref 65–99)
Potassium: 3.8 mmol/L (ref 3.5–5.2)
Sodium: 139 mmol/L (ref 134–144)
Total Protein: 6.6 g/dL (ref 6.0–8.5)

## 2020-08-13 LAB — LIPID PANEL
Chol/HDL Ratio: 4.2 ratio (ref 0.0–5.0)
Cholesterol, Total: 194 mg/dL (ref 100–199)
HDL: 46 mg/dL (ref >39)
LDL Chol Calc (NIH): 121 mg/dL — ABNORMAL HIGH (ref 0–99)
Triglycerides: 150 mg/dL — ABNORMAL HIGH (ref 0–149)
VLDL Cholesterol Cal: 27 mg/dL (ref 5–40)

## 2020-08-13 LAB — PSA: Prostate Specific Ag, Serum: 0.4 ng/mL (ref 0.0–4.0)

## 2020-12-31 ENCOUNTER — Other Ambulatory Visit: Payer: Self-pay | Admitting: Internal Medicine

## 2020-12-31 DIAGNOSIS — E782 Mixed hyperlipidemia: Secondary | ICD-10-CM

## 2020-12-31 DIAGNOSIS — I10 Essential (primary) hypertension: Secondary | ICD-10-CM

## 2021-03-30 ENCOUNTER — Other Ambulatory Visit: Payer: Self-pay | Admitting: Internal Medicine

## 2021-03-30 DIAGNOSIS — I10 Essential (primary) hypertension: Secondary | ICD-10-CM

## 2021-03-30 DIAGNOSIS — E782 Mixed hyperlipidemia: Secondary | ICD-10-CM

## 2021-03-30 DIAGNOSIS — R062 Wheezing: Secondary | ICD-10-CM

## 2021-03-30 NOTE — Telephone Encounter (Signed)
Notes to clinic:  This script has expired  Review for continued use and refill   Requested Prescriptions  Pending Prescriptions Disp Refills   albuterol (VENTOLIN HFA) 108 (90 Base) MCG/ACT inhaler [Pharmacy Med Name: Albuterol Sulfate HFA 108 (90 Base) MCG/ACT Inhalation Aerosol Solution] 18 g 0    Sig: INHALE 2 PUFFS BY MOUTH EVERY 6 HOURS AS NEEDED FOR WHEEZING FOR SHORTNESS OF BREATH      Pulmonology:  Beta Agonists Failed - 03/30/2021  6:38 AM      Failed - One inhaler should last at least one month. If the patient is requesting refills earlier, contact the patient to check for uncontrolled symptoms.      Passed - Valid encounter within last 12 months    Recent Outpatient Visits           7 months ago Annual physical exam   [facility] Glean Hess, MD   1 year ago Annual physical exam   [facility] Glean Hess, MD   2 years ago Annual physical exam   [facility] Glean Hess, MD   3 years ago [facility] Glean Hess, MD   3 years ago Essential hypertension   [facility] Glean Hess, MD       Future Appointments             In 4 months Army Melia Jesse Sans, MD [facility]              Signed Prescriptions Disp Refills   losartan (COZAAR) 50 MG tablet 90 tablet 0    Sig: Take 1 tablet by mouth once daily      Cardiovascular:  Angiotensin Receptor Blockers Failed - 03/30/2021  6:38 AM      Failed - Cr in normal range and within 180 days    Creatinine, Ser  Date Value Ref Range Status  08/12/2020 0.85 0.76 - 1.27 mg/dL Final          Failed - K in normal range and within 180 days    Potassium  Date Value Ref Range Status  08/12/2020 3.8 3.5 - 5.2 mmol/L Final          Failed - Valid encounter within last 6 months    Recent Outpatient Visits           7 months ago Annual physical exam   [facility] Glean Hess, MD   1 year ago  Annual physical exam   [facility] Glean Hess, MD   2 years ago Annual physical exam   [facility] Glean Hess, MD   3 years ago [facility] Glean Hess, MD   3 years ago Essential hypertension   [facility] Glean Hess, MD       Future Appointments             In 4 months Glean Hess, MD [facility] [place] - Patient is not pregnant      Passed - Last BP in normal range    BP Readings from Last 1 Encounters:  08/12/20 122/78            atorvastatin (LIPITOR) 10 MG tablet 90 tablet 0    Sig: Take 1 tablet by mouth once daily  Cardiovascular:  Antilipid - Statins Failed - 03/30/2021  6:38 AM      Failed - LDL in normal range and within 360 days    LDL Chol Calc (NIH)  Date Value Ref Range Status  08/12/2020 121 (H) 0 - 99 mg/dL Final          Failed - Triglycerides in normal range and within 360 days    Triglycerides  Date Value Ref Range Status  08/12/2020 150 (H) 0 - 149 mg/dL Final          Passed - Total Cholesterol in normal range and within 360 days    Cholesterol, Total  Date Value Ref Range Status  08/12/2020 194 100 - 199 mg/dL Final          Passed - HDL in normal range and within 360 days    HDL  Date Value Ref Range Status  08/12/2020 46 >39 mg/dL Final          Passed - Patient is not pregnant      Passed - Valid encounter within last 12 months    Recent Outpatient Visits           7 months ago Annual physical exam   [facility] Glean Hess, MD   1 year ago Annual physical exam   [facility] Glean Hess, MD   2 years ago Annual physical exam   [facility] Glean Hess, MD   3 years ago [facility] Glean Hess, MD   3 years ago Essential hypertension   [place], Laura H, MD       Future Appointments             In  4 months Army Melia Jesse Sans, MD [facility]

## 2021-03-31 ENCOUNTER — Ambulatory Visit
Admission: EM | Admit: 2021-03-31 | Discharge: 2021-03-31 | Disposition: A | Discharge status: Home / Self Care | Payer: BC Managed Care – PPO | Attending: Sports Medicine | Admitting: Sports Medicine

## 2021-03-31 ENCOUNTER — Other Ambulatory Visit: Payer: Self-pay

## 2021-03-31 ENCOUNTER — Ambulatory Visit: Payer: Self-pay | Admitting: *Deleted

## 2021-03-31 ENCOUNTER — Encounter: Payer: Self-pay | Admitting: Emergency Medicine

## 2021-03-31 DIAGNOSIS — R059 Cough, unspecified: Secondary | ICD-10-CM

## 2021-03-31 DIAGNOSIS — J40 Bronchitis, not specified as acute or chronic: Secondary | ICD-10-CM

## 2021-03-31 DIAGNOSIS — R062 Wheezing: Secondary | ICD-10-CM

## 2021-03-31 DIAGNOSIS — R0981 Nasal congestion: Secondary | ICD-10-CM | POA: Diagnosis not present

## 2021-03-31 MED ORDER — BENZONATATE 100 MG PO CAPS: 200.0000 mg | ORAL_CAPSULE | Freq: Three times a day (TID) | ORAL | 0 refills | Status: DC | PRN

## 2021-03-31 MED ORDER — PREDNISONE 10 MG (21) PO TBPK: ORAL_TABLET | Freq: Every day | ORAL | 0 refills | Status: DC

## 2021-03-31 MED ORDER — AZITHROMYCIN 250 MG PO TABS: 250.0000 mg | ORAL_TABLET | Freq: Every day | ORAL | 0 refills | Status: DC

## 2021-03-31 NOTE — ED Triage Notes (Signed)
Patient also states his stool has been green the last 2 days.

## 2021-03-31 NOTE — ED Triage Notes (Signed)
Patient in today c/o productive cough (clear) and wheezing x 4 days. Patient denies fever. Patient has used his albuterol inhaler and started taking zyrtec. Patient has had the covid vaccines, no booster. Patient had covid 01/2021.

## 2021-03-31 NOTE — Telephone Encounter (Signed)
Pt called with complaints of wheezing since 03/27/21; he is also coughing and having shortness of breath; thept says he has been using his albuterol inhaler; his cough is productive with clear secretions; his shortness of breath occurswith exertion; recommendations made per nurse triage protocol; he verbalized understanding and will go to the ED or Urgent Care for evaluation; the pt is seen by Dr Halina Maidens at [facility]; will route to office for notification of encounter. Reason for Disposition . [1] MILD difficulty breathing (e.g., minimal/no SOB at rest, SOB with walking, pulse <100) AND [2] NEW-onset or WORSE than normal  Answer Assessment - Initial Assessment Questions 1. RESPIRATORY STATUS: "Describe your breathing?" (e.g., wheezing, shortness of breath, unable to speak, severe coughing)      wheezing 2. ONSET: "When did this breathing problem begin?"      03/27/21 3. PATTERN "Does the difficult breathing come and go, or has it been constant since it started?"      intermittent 4. SEVERITY: "How bad is your breathing?" (e.g., mild, moderate, severe)    - MILD: No SOB at rest, mild SOB with walking, speaks normally in sentences, can lay down, no retractions, pulse < 100.    - MODERATE: SOB at rest, SOB with minimal exertion and prefers to sit, cannot lie down flat, speaks in phrases, mild retractions, audible wheezing, pulse 100-120.    - SEVERE: Very SOB at rest, speaks in single words, struggling to breathe, sitting hunched forward, retractions, pulse > 120      moderate 5. RECURRENT SYMPTOM: "Have you had difficulty breathing before?" If Yes, ask: "When was the last time?" and "What happened that time?"     History asthma 6. CARDIAC HISTORY: "Do you have any history of heart disease?" (e.g., heart attack, angina, bypass surgery, angioplasty)      htn 7. LUNG HISTORY: "Do you have any history of lung disease?"  (e.g., pulmonary embolus, asthma, emphysema)   Albuterol inhaler for  wheezing and shortness of breath 8. CAUSE: "What do you think is causing the breathing problem?"      "lungs" 9. OTHER SYMPTOMS: "Do you have any other symptoms? (e.g., dizziness, runny nose, cough, chest pain, fever)     Productive cough 10. PREGNANCY: "Is there any chance you are pregnant?" "When was your last menstrual period?"     n/a 11. 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

## 2021-03-31 NOTE — Discharge Instructions (Signed)
Please see educational handouts

## 2021-04-01 NOTE — ED Provider Notes (Signed)
[facility]    CSN: [id number] Arrival date & time: 03/31/21  0906      History   Chief Complaint Chief Complaint  Patient presents with  . Cough  . Wheezing    HPI Lawrence Vega is a 55 y.o. male.   Patient is a pleasant 55 year old male who presents for evaluation of the above issue.  Normally sees Dr. Arby Barrette for ongoing medical care but could not get in to see her today.  He reports his symptoms began about 4 to 5 days ago.  He said he was outside all weekend.  The pollen count is quite high.  He has no actual diagnosis of seasonal allergies but he does take Zyrtec as needed during this time of year.  He started that on Monday when his symptoms began.  He started having cough increased mucus production and a little bit of a wheeze.  He is also noted some shortness of breath that is worse with exertion.  No chest pain.  No chest pressure.  No diaphoresis.  No significant cardiac history.  He denies any sinus pressure.  No history of asthma.  He does have some sinus drainage and a sensation of postnasal drip.  He is a former smoker but he quit 10 years ago.  No confirmed diagnosis of COPD.  Given that he is not getting better he comes in today for initial urgent care evaluation.  No red flag signs or symptoms elicited on history.     Past Medical History:  Diagnosis Date  . BPH (benign prostatic hyperplasia)   . Encounter for screening colonoscopy   . Heartburn   . Hypertension   . Wheezing     Patient Active Problem List   Diagnosis Date Noted  . Benign neoplasm of ascending colon   . Benign neoplasm of rectosigmoid junction   . 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 Benign prostatic hyperplasia 09/23/2015  . 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 Hyperlipidemia 09/23/2015  . Tobacco use disorder, mild, in sustained remission 09/23/2015  . Hypertension 08/31/2011    Past Surgical History:  Procedure Laterality Date  . APPENDECTOMY     . CATARACT EXTRACTION, BILATERAL  2019  . COLONOSCOPY WITH PROPOFOL N/A 10/31/2018   Procedure: COLONOSCOPY WITH BIOPSIES;  Surgeon: Lucilla Lame, MD;  Location: [place];  Service: Endoscopy;  Laterality: N/A;  . FRACTURE SURGERY    . HEMORRHOID SURGERY    . POLYPECTOMY N/A 10/31/2018   Procedure: POLYPECTOMY;  Surgeon: Lucilla Lame, MD;  Location: [place];  Service: Endoscopy;  Laterality: N/A;       Home Medications    Prior to Admission medications   Medication Sig Start Date End Date Taking? Authorizing Provider  albuterol (VENTOLIN HFA) 108 (90 Base) MCG/ACT inhaler INHALE 2 PUFFS BY MOUTH EVERY 6 HOURS AS NEEDED FOR WHEEZING FOR SHORTNESS OF BREATH 03/30/21  Yes Glean Hess, MD  atorvastatin (LIPITOR) 10 MG tablet Take 1 tablet by mouth once daily 03/30/21  Yes Glean Hess, MD  azithromycin (ZITHROMAX) 250 MG tablet Take 1 tablet (250 mg total) by mouth daily. Take first 2 tablets together, then 1 every day until finished. 03/31/21  Yes Verda Cumins, MD  benzonatate (TESSALON) 100 MG capsule Take 2 capsules (200 mg total) by mouth 3 (three) times daily as needed for cough. 03/31/21  Yes Verda Cumins, MD  cetirizine (ZYRTEC) 10 MG tablet Take 10 mg by mouth daily.   Yes [provider]  losartan (COZAAR) 50 MG tablet Take 1 tablet by mouth once daily 03/30/21  Yes Glean Hess, MD  predniSONE (STERAPRED UNI-PAK 21 TAB) 10 MG (21) TBPK tablet Take by mouth daily. Take 6 tabs by mouth daily  for 2 days, then 5 tabs for 2 days, then 4 tabs for 2 days, then 3 tabs for 2 days, 2 tabs for 2 days, then 1 tab by mouth daily for 2 days 03/31/21  Yes Verda Cumins, MD    Family History Family History  Problem Relation Age of Onset  . Hypertension Father   . CAD Father 99  . AAA (abdominal aortic aneurysm) Father   . Asthma Mother     Social History Social History   Tobacco Use  . Smoking status: Former Smoker    Packs/day: 2.00     Years: 25.00    Pack years: 50.00    Types: Cigarettes    Quit date: 01/18/2011    Years since quitting: 10.2  . Smokeless tobacco: Never Used  Vaping Use  . Vaping Use: Never used  Substance Use Topics  . Alcohol use: No    Alcohol/week: 0.0 standard drinks  . Drug use: No     Allergies   Patient has no known allergies.   Review of Systems Review of Systems  Constitutional: Negative for activity change, appetite change, chills, diaphoresis and fever.  HENT: Positive for congestion and postnasal drip. Negative for ear discharge, ear pain, nosebleeds, sinus pressure, sinus pain, sneezing, sore throat and trouble swallowing.   Eyes: Negative.  Negative for pain.  Respiratory: Positive for cough, shortness of breath and wheezing. Negative for stridor.   Cardiovascular: Negative.  Negative for chest pain, palpitations and leg swelling.  Gastrointestinal: Negative for abdominal pain, constipation, diarrhea, nausea and vomiting.  Genitourinary: Negative.  Negative for dysuria, flank pain, hematuria and urgency.  Musculoskeletal: Negative.  Negative for arthralgias, back pain, myalgias, neck pain and neck stiffness.  Skin: Negative.  Negative for color change, pallor, rash and wound.  Neurological: Negative for dizziness, syncope, light-headedness, numbness and headaches.  All other systems reviewed and are negative.    Physical Exam Triage Vital Signs ED Triage Vitals [03/31/21 1018]  Enc Vitals Group     BP (!) 137/91     Pulse Rate 66     Resp 20     Temp 97.8 F (36.6 C)     Temp Source Oral     SpO2 97 %     Weight 205 lb (93 kg)     Height 6' (1.829 m)     Head Circumference      Peak Flow      Pain Score 0     Pain Loc      Pain Edu?      Excl. in [place]?    No data found.  Updated Vital Signs BP (!) 137/91 (BP Location: Left Arm)   Pulse 66   Temp 97.8 F (36.6 C) (Oral)   Resp 20   Ht 6' (1.829 m)   Wt 93 kg   SpO2 97%   BMI 27.80 kg/m   Visual  Acuity Right Eye Distance:   Left Eye Distance:   Bilateral Distance:    Right Eye Near:   Left Eye Near:    Bilateral Near:     Physical Exam Vitals and nursing note reviewed.  Constitutional:      General: He is not in acute distress.  Appearance: Normal appearance. He is not ill-appearing, toxic-appearing or diaphoretic.  HENT:     Head: Normocephalic and atraumatic.     Right Ear: Tympanic membrane normal.     Left Ear: Tympanic membrane normal.     Nose: Congestion present. No rhinorrhea.     Mouth/Throat:     Mouth: Mucous membranes are moist.     Pharynx: No oropharyngeal exudate or posterior oropharyngeal erythema.  Eyes:     General: No scleral icterus.       Right eye: No discharge.        Left eye: No discharge.     Extraocular Movements: Extraocular movements intact.     Conjunctiva/sclera: Conjunctivae normal.     Pupils: Pupils are equal, round, and reactive to light.  Cardiovascular:     Rate and Rhythm: Normal rate and regular rhythm.     Pulses: Normal pulses.     Heart sounds: Normal heart sounds. No murmur heard. No friction rub. No gallop.   Pulmonary:     Effort: Pulmonary effort is normal. No respiratory distress.     Breath sounds: No stridor. Wheezing present. No rhonchi or rales.  Musculoskeletal:     Cervical back: Normal range of motion and neck supple. No rigidity or tenderness.  Lymphadenopathy:     Cervical: Cervical adenopathy present.  Skin:    General: Skin is warm and dry.     Capillary Refill: Capillary refill takes less than 2 seconds.     Findings: No bruising, erythema, lesion or rash.  Neurological:     General: No focal deficit present.     Mental Status: He is alert and oriented to person, place, and time.  Psychiatric:        Mood and Affect: Mood normal.        Behavior: Behavior normal.      UC Treatments / Results  Labs (all labs ordered are listed, but only abnormal results are displayed) Labs Reviewed - No data  to display  EKG   Radiology No results found.  Procedures Procedures (including critical care time)  Medications Ordered in UC Medications - No data to display  Initial Impression / Assessment and Plan / UC Course  I have reviewed the triage vital signs and the nursing notes.  Pertinent labs & imaging results that were available during my care of the patient were reviewed by me and considered in my medical decision making (see chart for details).  Clinical impression: 4 to 5 days of cough with wheezing and postnasal drip with nasal congestion.  Consistent with bronchitis.  Treatment plan: 1.  The findings and treatment plan were discussed in detail with the patient.  Patient was in agreement. 2.  Will prescribe azithromycin, prednisone, and benzonatate Perles. 3.  Educational handouts provided. 4.  I do want him to follow-up with his primary care provider to ensure that he is improving. 5.  He is satting 97% on room air and although wheezing no need to get a chest x-ray today. 6.  If symptoms were to worsen in any way he knows to call 911 or go to the ER. 7.  He has albuterol and Zyrtec and have encouraged him to use this throughout his illness and he can discontinue it when he feels better. 8.  He was stable when he was discharged and he will follow with [place] as needed.    Final Clinical Impressions(s) / UC Diagnoses   Final diagnoses:  Bronchitis  Cough  Wheezing     Discharge Instructions     Please see educational handouts    ED Prescriptions    Medication Sig Dispense Auth. Provider   azithromycin (ZITHROMAX) 250 MG tablet Take 1 tablet (250 mg total) by mouth daily. Take first 2 tablets together, then 1 every day until finished. 6 tablet Verda Cumins, MD   predniSONE (STERAPRED UNI-PAK 21 TAB) 10 MG (21) TBPK tablet Take by mouth daily. Take 6 tabs by mouth daily  for 2 days, then 5 tabs for 2 days, then 4 tabs for 2 days, then 3 tabs for 2 days, 2 tabs for 2  days, then 1 tab by mouth daily for 2 days 42 tablet Verda Cumins, MD   benzonatate (TESSALON) 100 MG capsule Take 2 capsules (200 mg total) by mouth 3 (three) times daily as needed for cough. 30 capsule Verda Cumins, MD     PDMP not reviewed this encounter.   Verda Cumins, MD 04/01/21 [phone]

## 2021-04-03 NOTE — Telephone Encounter (Signed)
Noted.  Pt will go to ED or Urgent Care.  KP

## 2021-08-03 ENCOUNTER — Other Ambulatory Visit: Payer: Self-pay | Admitting: Internal Medicine

## 2021-08-03 DIAGNOSIS — E782 Mixed hyperlipidemia: Secondary | ICD-10-CM

## 2021-08-03 DIAGNOSIS — I10 Essential (primary) hypertension: Secondary | ICD-10-CM

## 2021-08-03 NOTE — Telephone Encounter (Signed)
Notes to clinic:  Patient has upcoming appt on 08/18/2021 Review for refills until that time   Requested Prescriptions  Pending Prescriptions Disp Refills   losartan (COZAAR) 50 MG tablet [Pharmacy Med Name: Losartan Potassium 50 MG Oral Tablet] 90 tablet 0    Sig: Take 1 tablet by mouth once daily     Cardiovascular:  Angiotensin Receptor Blockers Failed - 08/03/2021 10:00 AM      Failed - Cr in normal range and within 180 days    Creatinine, Ser  Date Value Ref Range Status  08/12/2020 0.85 0.76 - 1.27 mg/dL Final          Failed - K in normal range and within 180 days    Potassium  Date Value Ref Range Status  08/12/2020 3.8 3.5 - 5.2 mmol/L Final          Failed - Last BP in normal range    BP Readings from Last 1 Encounters:  03/31/21 (!) 137/91          Failed - Valid encounter within last 6 months    Recent Outpatient Visits           11 months ago Annual physical exam   [facility] Glean Hess, MD   1 year ago Annual physical exam   [facility] Glean Hess, MD   2 years ago Annual physical exam   [facility] Glean Hess, MD   3 years ago [facility] Glean Hess, MD   3 years ago Essential hypertension   [place], Laura H, MD       Future Appointments             In 2 weeks Glean Hess, MD [facility], [place] - Patient is not pregnant       atorvastatin (LIPITOR) 10 MG tablet [Pharmacy Med Name: Atorvastatin Calcium 10 MG Oral Tablet] 90 tablet 0    Sig: Take 1 tablet by mouth once daily     Cardiovascular:  Antilipid - Statins Failed - 08/03/2021 10:00 AM      Failed - LDL in normal range and within 360 days    LDL Chol Calc (NIH)  Date Value Ref Range Status  08/12/2020 121 (H) 0 - 99 mg/dL Final          Failed - Triglycerides in normal range and within 360 days    Triglycerides  Date Value Ref Range Status   08/12/2020 150 (H) 0 - 149 mg/dL Final          Passed - Total Cholesterol in normal range and within 360 days    Cholesterol, Total  Date Value Ref Range Status  08/12/2020 194 100 - 199 mg/dL Final          Passed - HDL in normal range and within 360 days    HDL  Date Value Ref Range Status  08/12/2020 46 >39 mg/dL Final          Passed - Patient is not pregnant      Passed - Valid encounter within last 12 months    Recent Outpatient Visits           11 months ago Annual physical exam   [facility] Glean Hess, MD   1 year ago Annual physical exam   [facility]  Glean Hess, MD   2 years ago Annual physical exam   [facility] Glean Hess, MD   3 years ago [facility] Glean Hess, MD   3 years ago Essential hypertension   [facility] Glean Hess, MD       Future Appointments             In 2 weeks Army Melia Jesse Sans, MD [facility], [facility]

## 2021-08-18 ENCOUNTER — Ambulatory Visit (INDEPENDENT_AMBULATORY_CARE_PROVIDER_SITE_OTHER): Payer: BC Managed Care – PPO | Admitting: Internal Medicine

## 2021-08-18 ENCOUNTER — Encounter: Payer: Self-pay | Admitting: Internal Medicine

## 2021-08-18 ENCOUNTER — Other Ambulatory Visit: Payer: Self-pay

## 2021-08-18 VITALS — BP 138/88 | HR 71 | Temp 97.9°F | Ht 72.0 in | Wt 201.0 lb

## 2021-08-18 DIAGNOSIS — E782 Mixed hyperlipidemia: Secondary | ICD-10-CM

## 2021-08-18 DIAGNOSIS — I10 Essential (primary) hypertension: Secondary | ICD-10-CM

## 2021-08-18 DIAGNOSIS — Z0000 Encounter for general adult medical examination without abnormal findings: Secondary | ICD-10-CM | POA: Diagnosis not present

## 2021-08-18 DIAGNOSIS — F17201 Nicotine dependence, unspecified, in remission: Secondary | ICD-10-CM

## 2021-08-18 DIAGNOSIS — Z23 Encounter for immunization: Secondary | ICD-10-CM

## 2021-08-18 DIAGNOSIS — R062 Wheezing: Secondary | ICD-10-CM

## 2021-08-18 DIAGNOSIS — N400 Enlarged prostate without lower urinary tract symptoms: Secondary | ICD-10-CM

## 2021-08-18 LAB — POCT URINALYSIS DIPSTICK
Bilirubin, UA: NEGATIVE
Blood, UA: NEGATIVE
Glucose, UA: NEGATIVE
Ketones, UA: NEGATIVE
Leukocytes, UA: NEGATIVE
Nitrite, UA: NEGATIVE
Protein, UA: NEGATIVE
Spec Grav, UA: 1.02 (ref 1.010–1.025)
Urobilinogen, UA: 0.2 E.U./dL
pH, UA: 6 (ref 5.0–8.0)

## 2021-08-18 MED ORDER — ATORVASTATIN CALCIUM 10 MG PO TABS: 10.0000 mg | ORAL_TABLET | Freq: Every day | ORAL | 1 refills | Status: DC

## 2021-08-18 MED ORDER — LOSARTAN POTASSIUM 50 MG PO TABS: 50.0000 mg | ORAL_TABLET | Freq: Every day | ORAL | 1 refills | Status: DC

## 2021-08-18 MED ORDER — UMECLIDINIUM-VILANTEROL 62.5-25 MCG/INH IN AEPB: 1.0000 | INHALATION_SPRAY | Freq: Every day | RESPIRATORY_TRACT | 0 refills | Status: DC

## 2021-08-18 NOTE — Progress Notes (Signed)
Date:  08/18/2021   Name:  Lawrence Vega   DOB:  09/21/1966   MRN:  [id number]   Chief Complaint: Annual Exam and Flu Vaccine WILFRID MAGUIRE is a 55 y.o. male who presents today for his Complete Annual Exam. He feels well. He reports exercising at work . He reports he is sleeping well.   Colonoscopy: 10/2018 repeat 2024  Immunization History  Administered Date(s) Administered   Influenza,inj,Quad PF,6+ Mos 09/30/2015, 08/29/2018, 08/07/2019, 08/12/2020   Influenza-Unspecified 10/21/2017   PFIZER(Purple Top)SARS-COV-2 Vaccination 03/03/2020, 03/29/2020   Tdap 09/30/2015    Hypertension This is a chronic problem. The problem is controlled (125/78 at home). Pertinent negatives include no chest pain, headaches, palpitations or shortness of breath. Past treatments include angiotensin blockers. The current treatment provides significant improvement.  Hyperlipidemia This is a chronic problem. The problem is controlled. Pertinent negatives include no chest pain, myalgias or shortness of breath. Current antihyperlipidemic treatment includes statins. The current treatment provides significant improvement of lipids.  Wheezing - recurrent, esp at night.  No SOB or cough, no limitation to activities. He is using albuterol as needed but would like something more long lasting.  He is an ex smoker.  Lab Results  Component Value Date   CREATININE 0.85 08/12/2020   BUN 11 08/12/2020   NA 139 08/12/2020   K 3.8 08/12/2020   CL 102 08/12/2020   CO2 24 08/12/2020   Lab Results  Component Value Date   CHOL 194 08/12/2020   HDL 46 08/12/2020   LDLCALC 121 (H) 08/12/2020   TRIG 150 (H) 08/12/2020   CHOLHDL 4.2 08/12/2020   Lab Results  Component Value Date   TSH 0.559 09/30/2015   No results found for: HGBA1C Lab Results  Component Value Date   WBC 6.9 08/12/2020   HGB 15.6 08/12/2020   HCT 47.9 08/12/2020   MCV 87 08/12/2020   PLT 207 08/12/2020   Lab Results  Component Value  Date   ALT 15 08/12/2020   AST 14 08/12/2020   ALKPHOS 90 08/12/2020   BILITOT 0.9 08/12/2020   Lab Results  Component Value Date   PSA1 0.4 08/12/2020   PSA1 0.5 08/07/2019   PSA1 0.5 08/29/2018     Review of Systems  Constitutional:  Negative for appetite change, chills, diaphoresis, fatigue and unexpected weight change.  HENT:  Negative for hearing loss, tinnitus, trouble swallowing and voice change.   Eyes:  Negative for visual disturbance.  Respiratory:  Negative for choking, shortness of breath and wheezing.   Cardiovascular:  Negative for chest pain, palpitations and leg swelling.  Gastrointestinal:  Negative for abdominal pain, blood in stool, constipation and diarrhea.  Genitourinary:  Negative for difficulty urinating, dysuria and frequency.  Musculoskeletal:  Negative for arthralgias, back pain and myalgias.  Skin:  Negative for color change and rash.  Neurological:  Negative for dizziness, syncope and headaches.  Hematological:  Negative for adenopathy.  Psychiatric/Behavioral:  Negative for dysphoric mood and sleep disturbance. The patient is not nervous/anxious.    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   Mixed hyperlipidemia 09/23/2015   Tobacco use disorder, mild, in sustained remission 09/23/2015   Essential hypertension 08/31/2011    No Known Allergies  Past Surgical History:  Procedure Laterality  Date   APPENDECTOMY     CATARACT EXTRACTION, BILATERAL  2019   COLONOSCOPY WITH PROPOFOL N/A 10/31/2018   Procedure: COLONOSCOPY WITH BIOPSIES;  Surgeon: Lucilla Lame, MD;  Location: [place];  Service: Endoscopy;  Laterality: N/A;   FRACTURE SURGERY     HEMORRHOID SURGERY     POLYPECTOMY N/A 10/31/2018   Procedure:  POLYPECTOMY;  Surgeon: Lucilla Lame, MD;  Location: [place];  Service: Endoscopy;  Laterality: N/A;    Social History   Tobacco Use   Smoking status: Former    Packs/day: 2.00    Years: 25.00    Pack years: 50.00    Types: Cigarettes    Quit date: 01/18/2011    Years since quitting: 10.5   Smokeless tobacco: Never  Vaping Use   Vaping Use: Never used  Substance Use Topics   Alcohol use: No    Alcohol/week: 0.0 standard drinks   Drug use: No     Medication list has been reviewed and updated.  Current Meds  Medication Sig   albuterol (VENTOLIN HFA) 108 (90 Base) MCG/ACT inhaler INHALE 2 PUFFS BY MOUTH EVERY 6 HOURS AS NEEDED FOR WHEEZING FOR SHORTNESS OF BREATH   cetirizine (ZYRTEC) 10 MG tablet Take 10 mg by mouth daily.   [DISCONTINUED] atorvastatin (LIPITOR) 10 MG tablet Take 1 tablet by mouth once daily   [DISCONTINUED] azithromycin (ZITHROMAX) 250 MG tablet Take 1 tablet (250 mg total) by mouth daily. Take first 2 tablets together, then 1 every day until finished.   [DISCONTINUED] losartan (COZAAR) 50 MG tablet Take 1 tablet by mouth once daily    PHQ 2/9 Scores 08/18/2021 08/12/2020 08/07/2019 08/29/2018  PHQ - 2 Score 0 0 0 0  PHQ- 9 Score 0 0 - -    GAD 7 : Generalized Anxiety Score 08/18/2021 08/12/2020  Nervous, Anxious, on Edge 0 0  Control/stop worrying 0 0  Worry too much - different things 0 0  Trouble relaxing 0 0  Restless 0 0  Easily annoyed or irritable 0 0  Afraid - awful might happen 0 0  Total GAD 7 Score 0 0  Anxiety Difficulty - Not difficult at all    BP Readings from Last 3 Encounters:  08/18/21 138/88  03/31/21 (!) 137/91  08/12/20 122/78    Physical Exam Vitals and nursing note reviewed.  Constitutional:      Appearance: Normal appearance. He is well-developed.  HENT:     Head: Normocephalic.     Right Ear: Tympanic membrane, ear canal and external ear normal.     Left Ear: Tympanic membrane, ear canal and external ear normal.      Nose: Nose normal.  Eyes:     Conjunctiva/sclera: Conjunctivae normal.     Pupils: Pupils are equal, round, and reactive to light.  Neck:     Thyroid: No thyromegaly.     Vascular: No carotid bruit.  Cardiovascular:     Rate and Rhythm: Normal rate and regular rhythm.     Heart sounds: Normal heart sounds.  Pulmonary:     Effort: Pulmonary effort is normal.     Breath sounds: Normal breath sounds. No wheezing.  Chest:  Breasts:    Right: No mass.     Left: No mass.  Abdominal:     General: Bowel sounds are normal.     Palpations: Abdomen is soft.     Tenderness: There is no abdominal tenderness.  Musculoskeletal:  General: Normal range of motion.     Cervical back: Normal range of motion and neck supple.  Lymphadenopathy:     Cervical: No cervical adenopathy.  Skin:    General: Skin is warm and dry.  Neurological:     Mental Status: He is alert and oriented to person, place, and time.     Deep Tendon Reflexes: Reflexes are normal and symmetric.  Psychiatric:        Attention and Perception: Attention normal.        Mood and Affect: Mood normal.        Thought Content: Thought content normal.    Wt Readings from Last 3 Encounters:  08/18/21 201 lb (91.2 kg)  03/31/21 205 lb (93 kg)  08/12/20 200 lb (90.7 kg)    BP 138/88   Pulse 71   Temp 97.9 F (36.6 C) (Oral)   Ht 6' (1.829 m)   Wt 201 lb (91.2 kg)   SpO2 97%   BMI 27.26 kg/m   Assessment and Plan: 1. Annual physical exam Normal exam Up to date on screenings. Shingrix vaccine discussed  2. Essential hypertension Clinically stable exam with well controlled BP. Tolerating medications without side effects at this time. Pt to continue current regimen and low sodium diet; benefits of regular exercise as able discussed. - CBC with Differential/Platelet - Comprehensive metabolic panel - POCT urinalysis dipstick - TSH - losartan (COZAAR) 50 MG tablet; Take 1 tablet (50 mg total) by mouth daily.   Dispense: 90 tablet; Refill: 1  3. Mixed hyperlipidemia Tolerating statin medication without side effects at this time Continue same therapy without change at this time. - Lipid panel - atorvastatin (LIPITOR) 10 MG tablet; Take 1 tablet (10 mg total) by mouth daily.  Dispense: 90 tablet; Refill: 1  4. Benign prostatic hyperplasia, unspecified whether lower urinary tract symptoms present No current sx; previous treatment with medication did not provide benefit - PSA  5. Tobacco use disorder, mild, in sustained remission Refer for screening - Ambulatory Referral Lung Cancer Screening [facility]  6. Wheezing Continue albuterol PRN Samples of Anoro given - Call for Rx if helpful   Partially dictated using Editor, commissioning. Any errors are unintentional.  Halina Maidens, MD [facility]  08/18/2021

## 2021-08-19 LAB — LIPID PANEL
Chol/HDL Ratio: 3.3 ratio (ref 0.0–5.0)
Cholesterol, Total: 160 mg/dL (ref 100–199)
HDL: 49 mg/dL (ref >39)
LDL Chol Calc (NIH): 94 mg/dL (ref 0–99)
Triglycerides: 92 mg/dL (ref 0–149)
VLDL Cholesterol Cal: 17 mg/dL (ref 5–40)

## 2021-08-19 LAB — TSH: TSH: 0.412 u[IU]/mL — ABNORMAL LOW (ref 0.450–4.500)

## 2021-08-19 LAB — COMPREHENSIVE METABOLIC PANEL
ALT: 16 IU/L (ref 0–44)
AST: 15 IU/L (ref 0–40)
Albumin/Globulin Ratio: 2.3 — ABNORMAL HIGH (ref 1.2–2.2)
Albumin: 4.5 g/dL (ref 3.8–4.9)
Alkaline Phosphatase: 97 IU/L (ref 44–121)
BUN/Creatinine Ratio: 11 (ref 9–20)
BUN: 11 mg/dL (ref 6–24)
Bilirubin Total: 0.5 mg/dL (ref 0.0–1.2)
CO2: 24 mmol/L (ref 20–29)
Calcium: 9.3 mg/dL (ref 8.7–10.2)
Chloride: 103 mmol/L (ref 96–106)
Creatinine, Ser: 0.96 mg/dL (ref 0.76–1.27)
Globulin, Total: 2 g/dL (ref 1.5–4.5)
Glucose: 88 mg/dL (ref 65–99)
Potassium: 4.9 mmol/L (ref 3.5–5.2)
Sodium: 139 mmol/L (ref 134–144)
Total Protein: 6.5 g/dL (ref 6.0–8.5)
eGFR: 95 mL/min/{1.73_m2} (ref >59)

## 2021-08-19 LAB — CBC WITH DIFFERENTIAL/PLATELET
Basophils Absolute: 0.1 10*3/uL (ref 0.0–0.2)
Basos: 1 %
EOS (ABSOLUTE): 0.1 10*3/uL (ref 0.0–0.4)
Eos: 2 %
Hematocrit: 47.6 % (ref 37.5–51.0)
Hemoglobin: 15.8 g/dL (ref 13.0–17.7)
Immature Grans (Abs): 0 10*3/uL (ref 0.0–0.1)
Immature Granulocytes: 0 %
Lymphocytes Absolute: 1.5 10*3/uL (ref 0.7–3.1)
Lymphs: 21 %
MCH: 28.7 pg (ref 26.6–33.0)
MCHC: 33.2 g/dL (ref 31.5–35.7)
MCV: 87 fL (ref 79–97)
Monocytes Absolute: 0.6 10*3/uL (ref 0.1–0.9)
Monocytes: 8 %
Neutrophils Absolute: 4.8 10*3/uL (ref 1.4–7.0)
Neutrophils: 68 %
Platelets: 205 10*3/uL (ref 150–450)
RBC: 5.5 x10E6/uL (ref 4.14–5.80)
RDW: 12.7 % (ref 11.6–15.4)
WBC: 7.1 10*3/uL (ref 3.4–10.8)

## 2021-08-19 LAB — PSA: Prostate Specific Ag, Serum: 0.5 ng/mL (ref 0.0–4.0)

## 2022-04-12 ENCOUNTER — Other Ambulatory Visit: Payer: Self-pay | Admitting: Internal Medicine

## 2022-04-12 DIAGNOSIS — I10 Essential (primary) hypertension: Secondary | ICD-10-CM

## 2022-04-13 NOTE — Telephone Encounter (Signed)
Requested Prescriptions  ?Pending Prescriptions Disp Refills  ?? losartan (COZAAR) 50 MG tablet [Pharmacy Med Name: Losartan Potassium 50 MG Oral Tablet] 90 tablet 0  ?  Sig: Take 1 tablet by mouth once daily  ?  ? Cardiovascular:  Angiotensin Receptor Blockers Failed - 04/12/2022  9:34 AM  ?  ?  Failed - Cr in normal range and within 180 days  ?  Creatinine, Ser  ?Date Value Ref Range Status  ?08/18/2021 0.96 0.76 - 1.27 mg/dL Final  ?   ?  ?  Failed - K in normal range and within 180 days  ?  Potassium  ?Date Value Ref Range Status  ?08/18/2021 4.9 3.5 - 5.2 mmol/L Final  ?   ?  ?  Failed - Valid encounter within last 6 months  ?  Recent Outpatient Visits   ?      ? 7 months ago Annual physical exam  ? [facility] Glean Hess, MD  ? 1 year ago Annual physical exam  ? [facility] Glean Hess, MD  ? 2 years ago Annual physical exam  ? [facility] Glean Hess, MD  ? 3 years ago Annual physical exam  ? [facility] Glean Hess, MD  ? 4 years ago Bronchitis  ? [facility] Glean Hess, MD  ?  ?  ?Future Appointments   ?        ? In 5 months Army Melia Jesse Sans, MD [facility], [place]  ?  ? ?  ?  ?  Passed - Patient is not pregnant  ?  ?  Passed - Last BP in normal range  ?  BP Readings from Last 1 Encounters:  ?08/18/21 138/88  ?   ?  ?  ? ? ?

## 2022-05-17 ENCOUNTER — Ambulatory Visit (INDEPENDENT_AMBULATORY_CARE_PROVIDER_SITE_OTHER): Payer: BC Managed Care – PPO | Admitting: Internal Medicine

## 2022-05-17 ENCOUNTER — Ambulatory Visit
Admission: RE | Admit: 2022-05-17 | Discharge: 2022-05-17 | Disposition: A | Discharge status: Home / Self Care | Payer: BC Managed Care – PPO | Source: Ambulatory Visit | Attending: Internal Medicine | Admitting: Internal Medicine

## 2022-05-17 ENCOUNTER — Ambulatory Visit
Admission: RE | Admit: 2022-05-17 | Discharge: 2022-05-17 | Disposition: A | Discharge status: Home / Self Care | Payer: BC Managed Care – PPO | Attending: Internal Medicine | Admitting: Internal Medicine

## 2022-05-17 ENCOUNTER — Encounter: Payer: Self-pay | Admitting: Internal Medicine

## 2022-05-17 DIAGNOSIS — R062 Wheezing: Secondary | ICD-10-CM | POA: Diagnosis not present

## 2022-05-17 MED ORDER — PREDNISONE 10 MG PO TABS: 10.0000 mg | ORAL_TABLET | ORAL | 0 refills | Status: AC

## 2022-05-17 MED ORDER — FLUTICASONE-SALMETEROL 100-50 MCG/ACT IN AEPB: 1.0000 | INHALATION_SPRAY | Freq: Two times a day (BID) | RESPIRATORY_TRACT | 3 refills | Status: DC

## 2022-05-17 NOTE — Progress Notes (Signed)
Date:  05/17/2022   Name:  Lawrence Vega   DOB:  10-Jul-1966   MRN:  [id number]   Chief Complaint: No chief complaint on file.  Wheezing  This is a recurrent problem. The current episode started more than 1 year ago. The problem occurs intermittently. The problem has been rapidly worsening (over the past several weeks). Associated symptoms include shortness of breath. Pertinent negatives include no chest pain, chills, coughing, diarrhea, fever, headaches, sputum production or swollen glands. The symptoms are aggravated by exercise and occupational exposure. He has tried beta agonist inhalers for the symptoms. The treatment provided moderate relief. Has gotten worse in the last month. Wheezing is more frequent.  Lab Results  Component Value Date   NA 139 08/18/2021   K 4.9 08/18/2021   CO2 24 08/18/2021   GLUCOSE 88 08/18/2021   BUN 11 08/18/2021   CREATININE 0.96 08/18/2021   CALCIUM 9.3 08/18/2021   EGFR 95 08/18/2021   GFRNONAA 100 08/12/2020   Lab Results  Component Value Date   CHOL 160 08/18/2021   HDL 49 08/18/2021   LDLCALC 94 08/18/2021   TRIG 92 08/18/2021   CHOLHDL 3.3 08/18/2021   Lab Results  Component Value Date   TSH 0.412 (L) 08/18/2021   No results found for: HGBA1C Lab Results  Component Value Date   WBC 7.1 08/18/2021   HGB 15.8 08/18/2021   HCT 47.6 08/18/2021   MCV 87 08/18/2021   PLT 205 08/18/2021   Lab Results  Component Value Date   ALT 16 08/18/2021   AST 15 08/18/2021   ALKPHOS 97 08/18/2021   BILITOT 0.5 08/18/2021   No results found for: 25OHVITD2, 25OHVITD3, VD25OH   Review of Systems  Constitutional:  Negative for chills, fatigue and fever.  HENT:  Negative for postnasal drip, sinus pressure and trouble swallowing.   Respiratory:  Positive for shortness of breath and wheezing. Negative for cough, sputum production, choking and chest tightness.   Cardiovascular:  Negative for chest pain and palpitations.  Gastrointestinal:   Negative for diarrhea.  Neurological:  Negative for headaches.   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   Mixed hyperlipidemia 09/23/2015   Tobacco use disorder, mild, in sustained remission 09/23/2015   Essential hypertension 08/31/2011    No Known Allergies  Past Surgical History:  Procedure Laterality Date   APPENDECTOMY     CATARACT EXTRACTION, BILATERAL  2019   COLONOSCOPY WITH PROPOFOL N/A 10/31/2018   Procedure: COLONOSCOPY WITH BIOPSIES;  Surgeon: Lucilla Lame, MD;  Location: [place];  Service: Endoscopy;  Laterality: N/A;   FRACTURE SURGERY     HEMORRHOID SURGERY     POLYPECTOMY N/A 10/31/2018   Procedure: POLYPECTOMY;  Surgeon: Lucilla Lame, MD;  Location: [place];  Service: Endoscopy;  Laterality: N/A;    Social History   Tobacco Use   Smoking status: Former    Packs/day: 2.00    Years: 25.00    Pack years: 50.00    Types: Cigarettes    Quit date: 01/18/2011    Years since quitting: 11.3   Smokeless tobacco: Never  Vaping Use   Vaping Use: Never used  Substance Use Topics   Alcohol use: No    Alcohol/week: 0.0 standard drinks  Drug use: No     Medication list has been reviewed and updated.  Current Meds  Medication Sig   albuterol (VENTOLIN HFA) 108 (90 Base) MCG/ACT inhaler INHALE 2 PUFFS BY MOUTH EVERY 6 HOURS AS NEEDED FOR WHEEZING FOR SHORTNESS OF BREATH   atorvastatin (LIPITOR) 10 MG tablet Take 1 tablet (10 mg total) by mouth daily.   cetirizine (ZYRTEC) 10 MG tablet Take 10 mg by mouth daily.   losartan (COZAAR) 50 MG tablet Take 1 tablet by mouth once daily   umeclidinium-vilanterol (ANORO ELLIPTA) 62.5-25 MCG/INH AEPB Inhale 1 puff into the lungs daily at 6 (six) AM.       08/18/2021    8:37  AM 08/12/2020    8:04 AM  GAD 7 : Generalized Anxiety Score  Nervous, Anxious, on Edge 0 0  Control/stop worrying 0 0  Worry too much - different things 0 0  Trouble relaxing 0 0  Restless 0 0  Easily annoyed or irritable 0 0  Afraid - awful might happen 0 0  Total GAD 7 Score 0 0  Anxiety Difficulty  Not difficult at all       08/18/2021    8:37 AM  Depression screen PHQ 2/9  Decreased Interest 0  Down, Depressed, Hopeless 0  PHQ - 2 Score 0  Altered sleeping 0  Tired, decreased energy 0  Change in appetite 0  Feeling bad or failure about yourself  0  Trouble concentrating 0  Moving slowly or fidgety/restless 0  Suicidal thoughts 0  PHQ-9 Score 0  Difficult doing work/chores Not difficult at all    BP Readings from Last 3 Encounters:  05/17/22 128/70  08/18/21 138/88  03/31/21 (!) 137/91    Physical Exam Vitals and nursing note reviewed.  Constitutional:      General: He is not in acute distress.    Appearance: Normal appearance. He is well-developed.  HENT:     Head: Normocephalic and atraumatic.  Neck:     Vascular: No carotid bruit.  Cardiovascular:     Rate and Rhythm: Normal rate and regular rhythm.     Pulses: Normal pulses.     Heart sounds: No murmur heard. Pulmonary:     Effort: Pulmonary effort is normal. No accessory muscle usage, prolonged expiration or respiratory distress.     Breath sounds: Wheezing (on expiration) present. No rhonchi.  Musculoskeletal:     Cervical back: Normal range of motion.     Right lower leg: No edema.     Left lower leg: No edema.  Lymphadenopathy:     Cervical: No cervical adenopathy.  Skin:    General: Skin is warm and dry.     Findings: No rash.  Neurological:     Mental Status: He is alert and oriented to person, place, and time.  Psychiatric:        Mood and Affect: Mood normal.        Behavior: Behavior normal.    Wt Readings from Last 3 Encounters:  05/17/22 203 lb (92.1 kg)  08/18/21 201 lb (91.2 kg)   03/31/21 205 lb (93 kg)    BP 128/70   Pulse 68   Ht 6' (1.829 m)   Wt 203 lb (92.1 kg)   SpO2 98%   BMI 27.53 kg/m   Assessment and Plan: 1. Wheezing COPD vs adult onset asthma Will treat with prednisone taper and begin Advair BID He is encouraged to use albuterol qid as needed for any wheezing  Consider LDCT screening once symptoms are controlled (he has been referred but delayed scheduling) - fluticasone-salmeterol (ADVAIR) 100-50 MCG/ACT AEPB; Inhale 1 puff into the lungs 2 (two) times daily.  Dispense: 1 each; Refill: 3 - predniSONE (DELTASONE) 10 MG tablet; Take 1 tablet (10 mg total) by mouth as directed for 6 days. Take 6,5,4,3,2,1 then stop  Dispense: 21 tablet; Refill: 0 - DG Chest 2 View   Partially dictated using Editor, commissioning. Any errors are unintentional.  Halina Maidens, MD [facility]  05/17/2022

## 2022-06-18 ENCOUNTER — Other Ambulatory Visit: Payer: Self-pay | Admitting: Internal Medicine

## 2022-06-18 DIAGNOSIS — E782 Mixed hyperlipidemia: Secondary | ICD-10-CM

## 2022-06-20 NOTE — Telephone Encounter (Signed)
Requested Prescriptions  Pending Prescriptions Disp Refills  . atorvastatin (LIPITOR) 10 MG tablet [Pharmacy Med Name: Atorvastatin Calcium 10 MG Oral Tablet] 90 tablet 0    Sig: Take 1 tablet by mouth once daily     Cardiovascular:  Antilipid - Statins Failed - 06/18/2022  9:49 AM      Failed - Lipid Panel in normal range within the last 12 months    Cholesterol, Total  Date Value Ref Range Status  08/18/2021 160 100 - 199 mg/dL Final   LDL Chol Calc (NIH)  Date Value Ref Range Status  08/18/2021 94 0 - 99 mg/dL Final   HDL  Date Value Ref Range Status  08/18/2021 49 >39 mg/dL Final   Triglycerides  Date Value Ref Range Status  08/18/2021 92 0 - 149 mg/dL Final         Passed - Patient is not pregnant      Passed - Valid encounter within last 12 months    Recent Outpatient Visits          1 month ago [facility] Glean Hess, MD   10 months ago Annual physical exam   [facility] Glean Hess, MD   1 year ago Annual physical exam   [facility] Glean Hess, MD   2 years ago Annual physical exam   [facility] Glean Hess, MD   3 years ago Annual physical exam   [facility] Glean Hess, MD      Future Appointments            In 3 months Army Melia Jesse Sans, MD [facility], [facility]

## 2022-09-09 ENCOUNTER — Other Ambulatory Visit: Payer: Self-pay | Admitting: Internal Medicine

## 2022-09-09 DIAGNOSIS — E782 Mixed hyperlipidemia: Secondary | ICD-10-CM

## 2022-09-10 ENCOUNTER — Other Ambulatory Visit: Payer: Self-pay | Admitting: Internal Medicine

## 2022-09-10 DIAGNOSIS — R062 Wheezing: Secondary | ICD-10-CM

## 2022-09-10 NOTE — Telephone Encounter (Signed)
Requested Prescriptions  Pending Prescriptions Disp Refills  . atorvastatin (LIPITOR) 10 MG tablet [Pharmacy Med Name: Atorvastatin Calcium 10 MG Oral Tablet] 90 tablet 0    Sig: Take 1 tablet by mouth once daily     Cardiovascular:  Antilipid - Statins Failed - 09/09/2022  3:52 PM      Failed - Lipid Panel in normal range within the last 12 months    Cholesterol, Total  Date Value Ref Range Status  08/18/2021 160 100 - 199 mg/dL Final   LDL Chol Calc (NIH)  Date Value Ref Range Status  08/18/2021 94 0 - 99 mg/dL Final   HDL  Date Value Ref Range Status  08/18/2021 49 >39 mg/dL Final   Triglycerides  Date Value Ref Range Status  08/18/2021 92 0 - 149 mg/dL Final         Passed - Patient is not pregnant      Passed - Valid encounter within last 12 months    Recent Outpatient Visits          3 months ago [place] [facility] at [facility], Jesse Sans, MD   1 year ago Annual physical exam   [place] [facility] at [facility], Jesse Sans, MD   2 years ago Annual physical exam   [place] [facility] at [facility], Jesse Sans, MD   3 years ago Annual physical exam   [facility] at [facility], Jesse Sans, MD   4 years ago Annual physical exam   [facility] at [facility], Jesse Sans, MD      Future Appointments            In 1 week Army Melia, Jesse Sans, MD [place] [facility] at [facility], [facility]

## 2022-09-11 NOTE — Telephone Encounter (Signed)
Requested Prescriptions  Pending Prescriptions Disp Refills  . ADVAIR DISKUS 100-50 MCG/ACT AEPB [Pharmacy Med Name: Advair Diskus 100-50 MCG/DOSE Inhalation Aerosol Powder Breath Activated] 60 each 0    Sig: INHALE 1 DOSE BY MOUTH TWICE DAILY     Pulmonology:  Combination Products Passed - 09/10/2022  9:13 AM      Passed - Valid encounter within last 12 months    Recent Outpatient Visits          3 months ago [place] [facility] at [facility], Jesse Sans, MD   1 year ago Annual physical exam   [place] [facility] at [facility], Jesse Sans, MD   2 years ago Annual physical exam   [place] [facility] at [facility], Jesse Sans, MD   3 years ago Annual physical exam   [facility] at [facility], Jesse Sans, MD   4 years ago Annual physical exam   [facility] Medicine at [facility], Jesse Sans, MD      Future Appointments            In 1 week Army Melia Jesse Sans, MD [place] [facility] at [facility], [facility]

## 2022-09-21 ENCOUNTER — Encounter: Payer: Self-pay | Admitting: Internal Medicine

## 2022-09-21 ENCOUNTER — Ambulatory Visit (INDEPENDENT_AMBULATORY_CARE_PROVIDER_SITE_OTHER): Payer: BC Managed Care – PPO | Admitting: Internal Medicine

## 2022-09-21 VITALS — BP 120/76 | HR 86 | Ht 72.0 in | Wt 208.0 lb

## 2022-09-21 DIAGNOSIS — Z23 Encounter for immunization: Secondary | ICD-10-CM

## 2022-09-21 DIAGNOSIS — I10 Essential (primary) hypertension: Secondary | ICD-10-CM

## 2022-09-21 DIAGNOSIS — N400 Enlarged prostate without lower urinary tract symptoms: Secondary | ICD-10-CM

## 2022-09-21 DIAGNOSIS — E782 Mixed hyperlipidemia: Secondary | ICD-10-CM

## 2022-09-21 DIAGNOSIS — R7989 Other specified abnormal findings of blood chemistry: Secondary | ICD-10-CM

## 2022-09-21 DIAGNOSIS — Z131 Encounter for screening for diabetes mellitus: Secondary | ICD-10-CM | POA: Diagnosis not present

## 2022-09-21 DIAGNOSIS — R062 Wheezing: Secondary | ICD-10-CM

## 2022-09-21 DIAGNOSIS — Z0184 Encounter for antibody response examination: Secondary | ICD-10-CM

## 2022-09-21 DIAGNOSIS — Z0000 Encounter for general adult medical examination without abnormal findings: Secondary | ICD-10-CM

## 2022-09-21 DIAGNOSIS — J4520 Mild intermittent asthma, uncomplicated: Secondary | ICD-10-CM

## 2022-09-21 DIAGNOSIS — J45909 Unspecified asthma, uncomplicated: Secondary | ICD-10-CM | POA: Insufficient documentation

## 2022-09-21 LAB — POCT URINALYSIS DIPSTICK
Bilirubin, UA: NEGATIVE
Blood, UA: NEGATIVE
Glucose, UA: NEGATIVE
Ketones, UA: NEGATIVE
Leukocytes, UA: NEGATIVE
Nitrite, UA: NEGATIVE
Protein, UA: NEGATIVE
Spec Grav, UA: 1.01 (ref 1.010–1.025)
Urobilinogen, UA: 0.2 E.U./dL
pH, UA: 6 (ref 5.0–8.0)

## 2022-09-21 MED ORDER — LOSARTAN POTASSIUM 50 MG PO TABS: 50.0000 mg | ORAL_TABLET | Freq: Every day | ORAL | 3 refills | Status: DC

## 2022-09-21 MED ORDER — ADVAIR DISKUS 100-50 MCG/ACT IN AEPB: INHALATION_SPRAY | RESPIRATORY_TRACT | 5 refills | Status: DC

## 2022-09-21 MED ORDER — ALBUTEROL SULFATE HFA 108 (90 BASE) MCG/ACT IN AERS: INHALATION_SPRAY | RESPIRATORY_TRACT | 5 refills | Status: AC

## 2022-09-21 MED ORDER — ATORVASTATIN CALCIUM 10 MG PO TABS: 10.0000 mg | ORAL_TABLET | Freq: Every day | ORAL | 3 refills | Status: DC

## 2022-09-21 NOTE — Progress Notes (Signed)
Date:  09/21/2022   Name:  Lawrence Vega   DOB:  02/25/1966   MRN:  [id number]   Chief Complaint: Annual Exam Lawrence Vega is a 56 y.o. male who presents today for his Complete Annual Exam. He feels well. He reports exercising. He reports he is sleeping well. He does not think he had Varicella as a child although all three of his children had it and he did not get it.  Colonoscopy: 10/2018 repeat 5 yrs  Immunization History  Administered Date(s) Administered   Influenza,inj,Quad PF,6+ Mos 09/30/2015, 08/29/2018, 08/07/2019, 08/12/2020, 08/18/2021   Influenza-Unspecified 10/21/2017   PFIZER(Purple Top)SARS-COV-2 Vaccination 03/03/2020, 03/29/2020   Tdap 09/30/2015   Health Maintenance Due  Topic Date Due   Zoster Vaccines- Shingrix (1 of 2) Never done   INFLUENZA VACCINE  07/17/2022    Lab Results  Component Value Date   PSA1 0.5 08/18/2021   PSA1 0.4 08/12/2020   PSA1 0.5 08/07/2019     Hypertension This is a chronic problem. The problem is controlled. Pertinent negatives include no chest pain, headaches, palpitations or shortness of breath. Past treatments include angiotensin blockers. The current treatment provides significant improvement. There is no history of kidney disease, CAD/MI or CVA.  Hyperlipidemia This is a chronic problem. The problem is controlled. Pertinent negatives include no chest pain, myalgias or shortness of breath. Current antihyperlipidemic treatment includes statins. The current treatment provides significant improvement of lipids.  Asthma There is no shortness of breath or wheezing. The problem has been rapidly worsening. Pertinent negatives include no appetite change, chest pain, headaches, myalgias or trouble swallowing. His symptoms are alleviated by beta-agonist and steroid inhaler. He reports complete improvement on treatment. His past medical history is significant for asthma.    Lab Results  Component Value Date   NA 139 08/18/2021    K 4.9 08/18/2021   CO2 24 08/18/2021   GLUCOSE 88 08/18/2021   BUN 11 08/18/2021   CREATININE 0.96 08/18/2021   CALCIUM 9.3 08/18/2021   EGFR 95 08/18/2021   GFRNONAA 100 08/12/2020   Lab Results  Component Value Date   CHOL 160 08/18/2021   HDL 49 08/18/2021   LDLCALC 94 08/18/2021   TRIG 92 08/18/2021   CHOLHDL 3.3 08/18/2021   Lab Results  Component Value Date   TSH 0.412 (L) 08/18/2021   No results found for: "HGBA1C" Lab Results  Component Value Date   WBC 7.1 08/18/2021   HGB 15.8 08/18/2021   HCT 47.6 08/18/2021   MCV 87 08/18/2021   PLT 205 08/18/2021   Lab Results  Component Value Date   ALT 16 08/18/2021   AST 15 08/18/2021   ALKPHOS 97 08/18/2021   BILITOT 0.5 08/18/2021   No results found for: "25OHVITD2", "25OHVITD3", "VD25OH"   Review of Systems  Constitutional:  Negative for appetite change, chills, diaphoresis, fatigue and unexpected weight change.  HENT:  Negative for hearing loss, tinnitus, trouble swallowing and voice change.   Eyes:  Negative for visual disturbance.  Respiratory:  Negative for choking, shortness of breath and wheezing.   Cardiovascular:  Negative for chest pain, palpitations and leg swelling.  Gastrointestinal:  Negative for abdominal pain, blood in stool, constipation and diarrhea.  Genitourinary:  Negative for difficulty urinating, dysuria and frequency.  Musculoskeletal:  Negative for arthralgias, back pain and myalgias.  Skin:  Negative for color change and rash.  Neurological:  Negative for dizziness, syncope and headaches.  Hematological:  Negative for adenopathy.  Psychiatric/Behavioral:  Negative for dysphoric mood and sleep disturbance. The patient is not nervous/anxious.     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   Mixed hyperlipidemia 09/23/2015   Tobacco use disorder, mild, in sustained remission 09/23/2015   Essential hypertension 08/31/2011    No Known Allergies  Past Surgical History:  Procedure Laterality Date   APPENDECTOMY     CATARACT EXTRACTION, BILATERAL  2019   COLONOSCOPY WITH PROPOFOL N/A 10/31/2018   Procedure: COLONOSCOPY WITH BIOPSIES;  Surgeon: Lucilla Lame, MD;  Location: [place];  Service: Endoscopy;  Laterality: N/A;   FRACTURE SURGERY     HEMORRHOID SURGERY     POLYPECTOMY N/A 10/31/2018   Procedure: POLYPECTOMY;  Surgeon: Lucilla Lame, MD;  Location: [place];  Service: Endoscopy;  Laterality: N/A;    Social History   Tobacco Use   Smoking status: Former    Packs/day: 2.00    Years: 25.00    Total pack years: 50.00    Types: Cigarettes    Quit date: 01/18/2011    Years since quitting: 11.6   Smokeless tobacco: Never  Vaping Use   Vaping Use: Never used  Substance Use Topics   Alcohol use: No    Alcohol/week: 0.0 standard drinks of alcohol   Drug use: No     Medication list has been reviewed and updated.  Current Meds  Medication Sig   ADVAIR DISKUS 100-50 MCG/ACT AEPB INHALE 1 DOSE BY MOUTH TWICE DAILY   albuterol (VENTOLIN HFA) 108 (90 Base) MCG/ACT inhaler INHALE 2 PUFFS BY MOUTH EVERY 6 HOURS AS NEEDED FOR WHEEZING FOR SHORTNESS OF BREATH   atorvastatin (LIPITOR) 10 MG tablet Take 1 tablet by mouth once daily   losartan (COZAAR) 50 MG tablet Take 1 tablet by mouth once daily       09/21/2022    7:58 AM 08/18/2021    8:37 AM 08/12/2020    8:04 AM  GAD 7 : Generalized Anxiety Score  Nervous, Anxious, on Edge 0 0 0  Control/stop worrying 0 0 0  Worry too much - different things 0 0 0  Trouble relaxing 0 0 0  Restless 0 0 0  Easily annoyed or irritable 0 0 0  Afraid - awful might happen 0 0 0  Total GAD 7 Score 0 0 0  Anxiety Difficulty Not difficult at  all  Not difficult at all       09/21/2022    7:58 AM 08/18/2021    8:37 AM 08/12/2020    8:03 AM  Depression screen PHQ 2/9  Decreased Interest 0 0 0  Down, Depressed, Hopeless 0 0 0  PHQ - 2 Score 0 0 0  Altered sleeping 0 0 0  Tired, decreased energy 0 0 0  Change in appetite 0 0 0  Feeling bad or failure about yourself  0 0 0  Trouble concentrating 0 0 0  Moving slowly or fidgety/restless 0 0 0  Suicidal thoughts 0 0 0  PHQ-9 Score 0 0 0  Difficult doing work/chores Not difficult at all Not difficult at all Not difficult at all    BP Readings from Last 3 Encounters:  09/21/22 120/76  05/17/22 128/70  08/18/21 138/88    Physical Exam  Vitals and nursing note reviewed.  Constitutional:      Appearance: Normal appearance. He is well-developed.  HENT:     Head: Normocephalic.     Right Ear: Tympanic membrane, ear canal and external ear normal.     Left Ear: Tympanic membrane, ear canal and external ear normal.     Nose: Nose normal.  Eyes:     Conjunctiva/sclera: Conjunctivae normal.     Pupils: Pupils are equal, round, and reactive to light.  Neck:     Thyroid: No thyromegaly.     Vascular: No carotid bruit.  Cardiovascular:     Rate and Rhythm: Normal rate and regular rhythm.     Heart sounds: Normal heart sounds.  Pulmonary:     Effort: Pulmonary effort is normal.     Breath sounds: Normal breath sounds. No wheezing.  Chest:  Breasts:    Right: No mass.     Left: No mass.  Abdominal:     General: Bowel sounds are normal.     Palpations: Abdomen is soft.     Tenderness: There is no abdominal tenderness.  Musculoskeletal:        General: Normal range of motion.     Cervical back: Normal range of motion and neck supple.  Lymphadenopathy:     Cervical: No cervical adenopathy.  Skin:    General: Skin is warm and dry.  Neurological:     Mental Status: He is alert and oriented to person, place, and time.     Deep Tendon Reflexes: Reflexes are normal and  symmetric.  Psychiatric:        Attention and Perception: Attention normal.        Mood and Affect: Mood normal.        Thought Content: Thought content normal.     Wt Readings from Last 3 Encounters:  09/21/22 208 lb (94.3 kg)  05/17/22 203 lb (92.1 kg)  08/18/21 201 lb (91.2 kg)    BP 120/76 (BP Location: Left Arm, Cuff Size: Large)   Pulse 86   Ht 6' (1.829 m)   Wt 208 lb (94.3 kg)   SpO2 96%   BMI 28.21 kg/m   Assessment and Plan: 1. Annual physical exam Normal exam. Continue healthy diet and exercise - CBC with Differential/Platelet - Comprehensive metabolic panel - Hemoglobin A1c - Lipid panel - PSA - TSH + free T4  2. Essential hypertension Clinically stable exam with well controlled BP. Tolerating medications without side effects at this time. Pt to continue current regimen and low sodium diet; benefits of regular exercise as able discussed. - CBC with Differential/Platelet - Comprehensive metabolic panel - POCT urinalysis dipstick - losartan (COZAAR) 50 MG tablet; Take 1 tablet (50 mg total) by mouth daily.  Dispense: 90 tablet; Refill: 3  3. Mixed hyperlipidemia - Lipid panel - atorvastatin (LIPITOR) 10 MG tablet; Take 1 tablet (10 mg total) by mouth daily.  Dispense: 90 tablet; Refill: 3  4. Screening for diabetes mellitus - Hemoglobin A1c  5. Benign prostatic hyperplasia, unspecified whether lower urinary tract symptoms present DRE deferred. Symptoms mild and manageable. - PSA  6. Wheezing - albuterol (VENTOLIN HFA) 108 (90 Base) MCG/ACT inhaler; INHALE 2 PUFFS BY MOUTH EVERY 6 HOURS AS NEEDED FOR WHEEZING FOR SHORTNESS OF BREATH  Dispense: 18 g; Refill: 5  7. Low TSH level Repeat labs with T4 - TSH + free T4  8. Mild intermittent reactive airway disease without complication He has had complete resolution of his  symptoms on Advair.  No URI, wheezing or thrush. - ADVAIR DISKUS 100-50 MCG/ACT AEPB; INHALE 1 DOSE BY MOUTH TWICE DAILY  Dispense:  60 each; Refill: 5  9. Immunity status testing If he had evidence of prior infection he should return for Shingles vaccines. - Varicella zoster antibody, IgG   Partially dictated using Editor, commissioning. Any errors are unintentional.  Halina Maidens, MD [facility]  09/21/2022

## 2022-09-22 LAB — HEMOGLOBIN A1C
Est. average glucose Bld gHb Est-mCnc: 108 mg/dL
Hgb A1c MFr Bld: 5.4 % (ref 4.8–5.6)

## 2022-09-22 LAB — CBC WITH DIFFERENTIAL/PLATELET
Basophils Absolute: 0 10*3/uL (ref 0.0–0.2)
Basos: 1 %
EOS (ABSOLUTE): 0.1 10*3/uL (ref 0.0–0.4)
Eos: 2 %
Hematocrit: 48 % (ref 37.5–51.0)
Hemoglobin: 15.9 g/dL (ref 13.0–17.7)
Immature Grans (Abs): 0 10*3/uL (ref 0.0–0.1)
Immature Granulocytes: 0 %
Lymphocytes Absolute: 1.8 10*3/uL (ref 0.7–3.1)
Lymphs: 25 %
MCH: 28.2 pg (ref 26.6–33.0)
MCHC: 33.1 g/dL (ref 31.5–35.7)
MCV: 85 fL (ref 79–97)
Monocytes Absolute: 0.6 10*3/uL (ref 0.1–0.9)
Monocytes: 9 %
Neutrophils Absolute: 4.4 10*3/uL (ref 1.4–7.0)
Neutrophils: 63 %
Platelets: 224 10*3/uL (ref 150–450)
RBC: 5.64 x10E6/uL (ref 4.14–5.80)
RDW: 12.2 % (ref 11.6–15.4)
WBC: 7 10*3/uL (ref 3.4–10.8)

## 2022-09-22 LAB — COMPREHENSIVE METABOLIC PANEL
ALT: 18 IU/L (ref 0–44)
AST: 15 IU/L (ref 0–40)
Albumin/Globulin Ratio: 2.2 (ref 1.2–2.2)
Albumin: 4.7 g/dL (ref 3.8–4.9)
Alkaline Phosphatase: 100 IU/L (ref 44–121)
BUN/Creatinine Ratio: 11 (ref 9–20)
BUN: 12 mg/dL (ref 6–24)
Bilirubin Total: 1 mg/dL (ref 0.0–1.2)
CO2: 23 mmol/L (ref 20–29)
Calcium: 9.4 mg/dL (ref 8.7–10.2)
Chloride: 100 mmol/L (ref 96–106)
Creatinine, Ser: 1.07 mg/dL (ref 0.76–1.27)
Globulin, Total: 2.1 g/dL (ref 1.5–4.5)
Glucose: 95 mg/dL (ref 70–99)
Potassium: 4.4 mmol/L (ref 3.5–5.2)
Sodium: 139 mmol/L (ref 134–144)
Total Protein: 6.8 g/dL (ref 6.0–8.5)
eGFR: 82 mL/min/{1.73_m2} (ref >59)

## 2022-09-22 LAB — PSA: Prostate Specific Ag, Serum: 0.5 ng/mL (ref 0.0–4.0)

## 2022-09-22 LAB — TSH+FREE T4
Free T4: 1.43 ng/dL (ref 0.82–1.77)
TSH: 0.81 u[IU]/mL (ref 0.450–4.500)

## 2022-09-22 LAB — LIPID PANEL
Chol/HDL Ratio: 3.9 ratio (ref 0.0–5.0)
Cholesterol, Total: 162 mg/dL (ref 100–199)
HDL: 42 mg/dL (ref >39)
LDL Chol Calc (NIH): 98 mg/dL (ref 0–99)
Triglycerides: 122 mg/dL (ref 0–149)
VLDL Cholesterol Cal: 22 mg/dL (ref 5–40)

## 2022-09-22 LAB — VARICELLA ZOSTER ANTIBODY, IGG: Varicella zoster IgG: 2026 index (ref >165)

## 2022-10-25 IMAGING — CR DG CHEST 2V
3 series · 3 of 3 positions shown · non-contrast
Comparison: September 06, 2017.

CLINICAL DATA: Wheezing for 2 years

EXAM:
CHEST - 2 VIEW

[chest pa (1 of 2)]
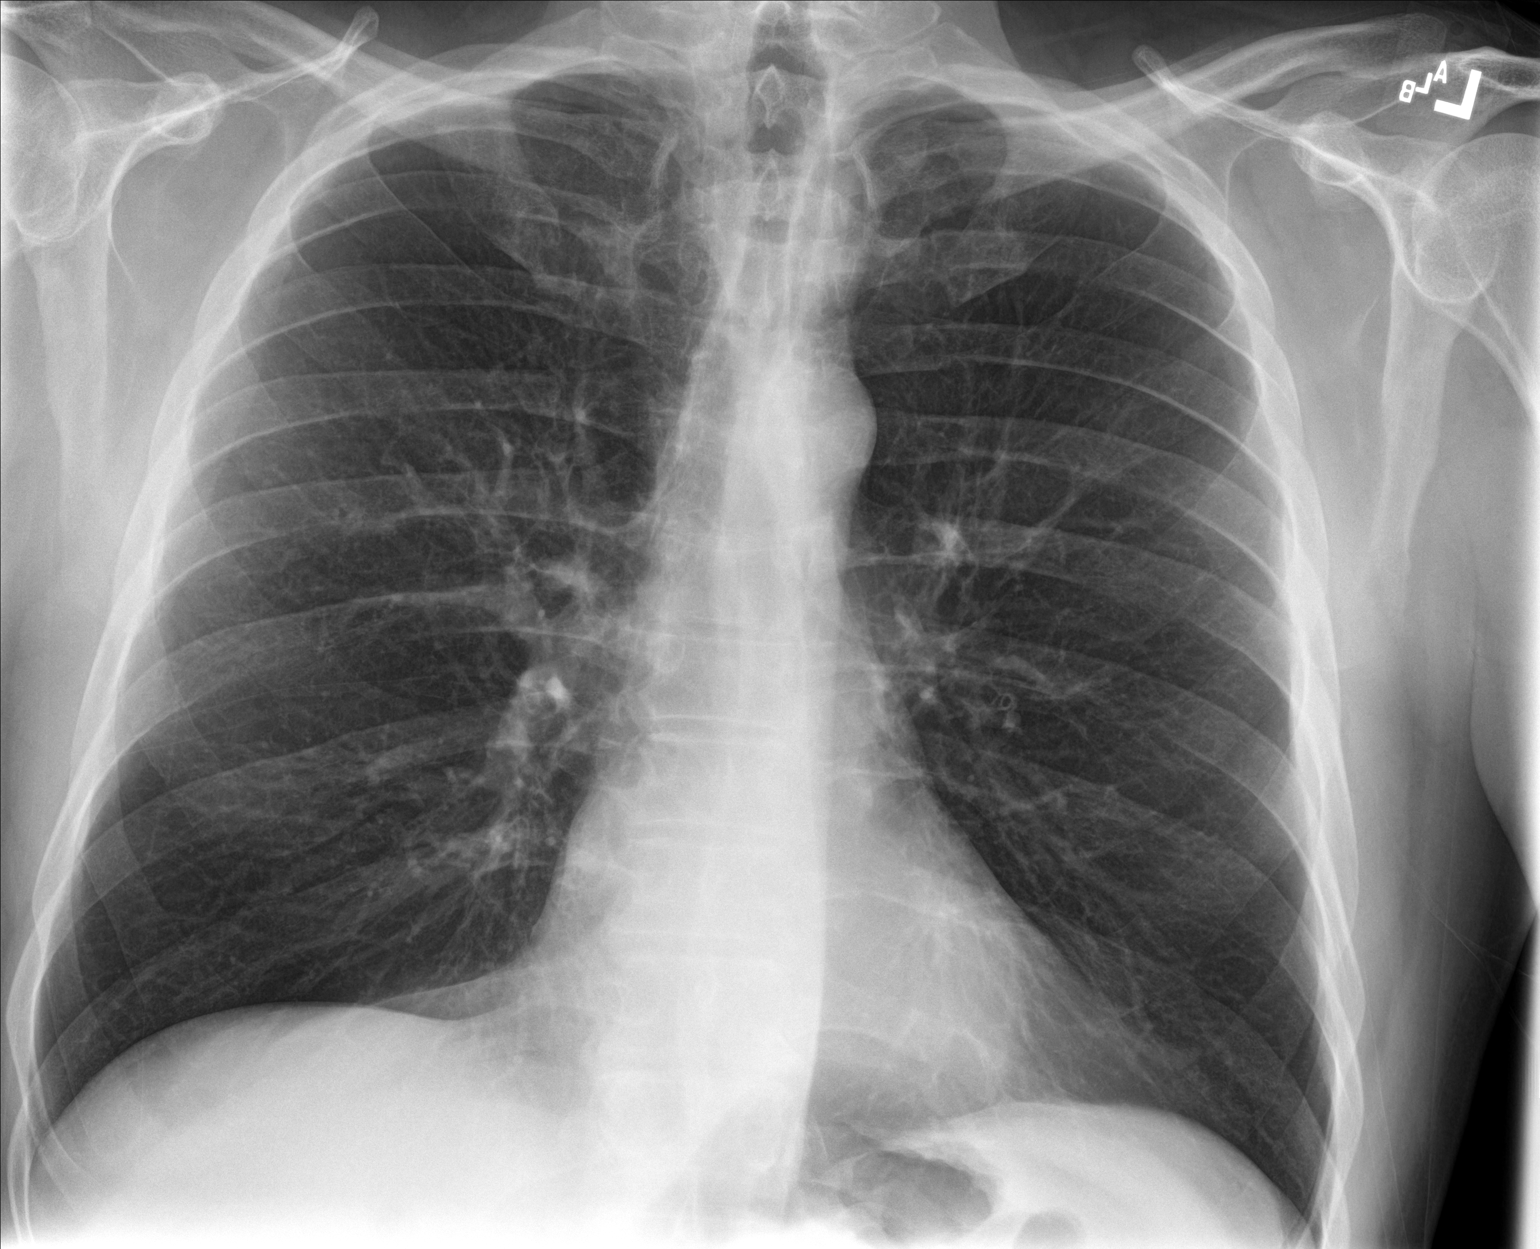

[chest lat]
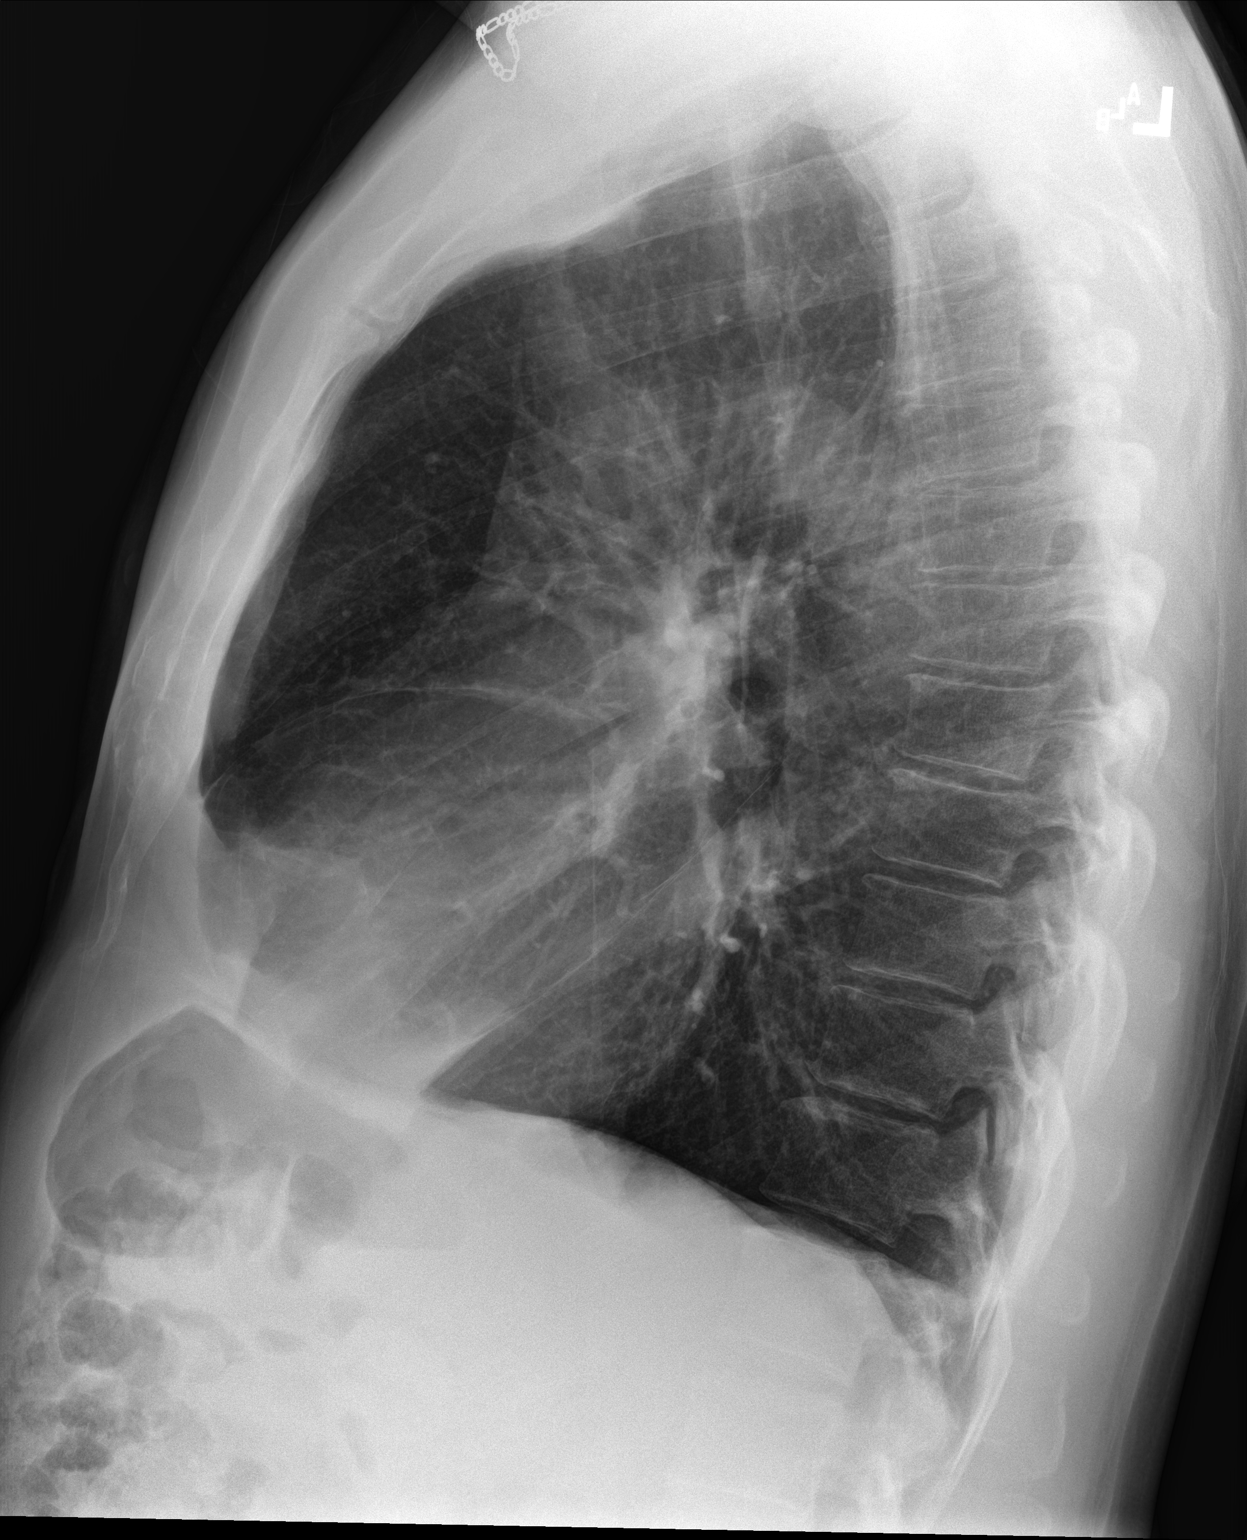

[chest pa (2 of 2)]
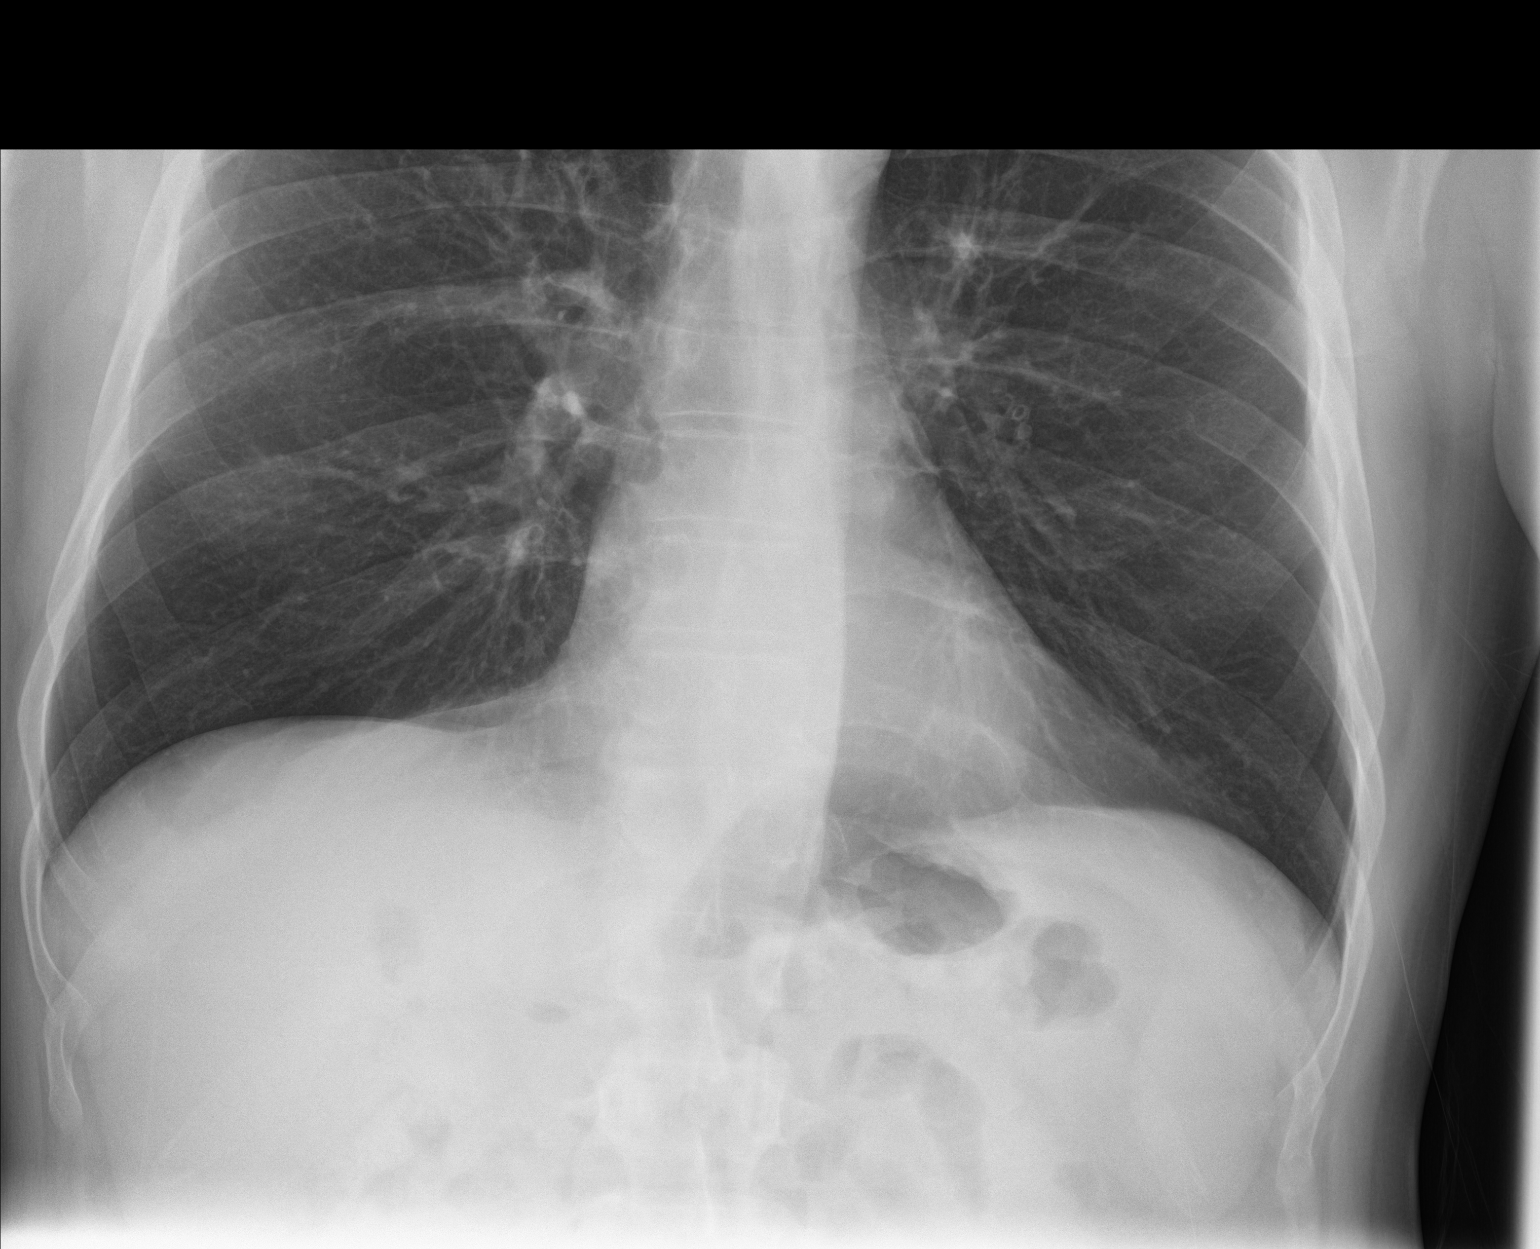

[3 of 3 positions shown; findings below may reference images not displayed]

FINDINGS: The heart size and mediastinal contours are within normal limits.
Both lungs are clear. The visualized skeletal structures are
unremarkable.
IMPRESSION: No active cardiopulmonary disease.

## 2023-03-18 ENCOUNTER — Telehealth: Payer: Self-pay | Admitting: Internal Medicine

## 2023-03-18 ENCOUNTER — Other Ambulatory Visit: Payer: Self-pay | Admitting: Internal Medicine

## 2023-03-18 DIAGNOSIS — J4520 Mild intermittent asthma, uncomplicated: Secondary | ICD-10-CM

## 2023-03-18 MED ORDER — FLUTICASONE-SALMETEROL 100-50 MCG/ACT IN AEPB: INHALATION_SPRAY | RESPIRATORY_TRACT | 5 refills | Status: DC

## 2023-03-18 NOTE — Telephone Encounter (Signed)
Pt reports that his insurance will not cover the brand name for the ADVAIR DISKUS 100-50 MCG/ACT AEPB so a new Rx is needed for a generic. Pt stated he was told by his pharmacy that a new Rx needed to be written to fill as generic because it shows brand only.

## 2023-04-01 ENCOUNTER — Ambulatory Visit
Admission: EM | Admit: 2023-04-01 | Discharge: 2023-04-01 | Disposition: A | Discharge status: Home / Self Care | Payer: BC Managed Care – PPO | Attending: Emergency Medicine | Admitting: Emergency Medicine

## 2023-04-01 DIAGNOSIS — S51811A Laceration without foreign body of right forearm, initial encounter: Secondary | ICD-10-CM

## 2023-04-01 NOTE — Discharge Instructions (Addendum)
Last tetanus is 2016, currently up-to-date  2 lacerations have been closed by skin adhesive, allow it to fall off on its own, do not peel  4 sutures placed in middle laceration, return in 10 to 14 days for removal  May cleanse daily with soap and water, pat do not rub, may apply topical antibiotic ointment and cover with a nonstick bandage until healed  May take Tylenol 500 to 1000 mg every 6 hours and/or ibuprofen 600 to 800 mg every 6-8 hours  At any point if you begin to see increased swelling, increased redness, puslike drainage please return for reevaluation

## 2023-04-01 NOTE — ED Provider Notes (Signed)
[facility]    CSN: [id number] Arrival date & time: 04/01/23  4098      History   Chief Complaint Chief Complaint  Patient presents with   Laceration    RT forearm     HPI Lawrence Vega is a 57 y.o. male.   Presents for evaluation of a laceration to the right forearm beginning this morning.  Was working on a camper when arm was hit with a grinder. Has cleansed with soap and water then came straight to urgent care.  Endorses a mild numbness as well as pain.  Last tetanus 2016.  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   Mixed hyperlipidemia 09/23/2015   Tobacco use disorder, mild, in sustained remission 09/23/2015   Essential hypertension 08/31/2011    Past Surgical History:  Procedure Laterality Date   APPENDECTOMY     CATARACT EXTRACTION, BILATERAL  2019   COLONOSCOPY WITH PROPOFOL N/A 10/31/2018   Procedure: COLONOSCOPY WITH BIOPSIES;  Surgeon: Midge Minium, MD;  Location: [facility];  Service: Endoscopy;  Laterality: N/A;   FRACTURE SURGERY     HEMORRHOID SURGERY     POLYPECTOMY N/A 10/31/2018   Procedure: POLYPECTOMY;  Surgeon: Midge Minium, MD;  Location: [facility];  Service: Endoscopy;  Laterality: N/A;       Home Medications    Prior to Admission medications   Medication Sig Start Date End Date Taking? Authorizing Provider  albuterol (VENTOLIN HFA) 108 (90 Base) MCG/ACT inhaler INHALE 2 PUFFS BY MOUTH EVERY 6 HOURS AS NEEDED FOR WHEEZING FOR SHORTNESS OF BREATH 09/21/22   Reubin Milan, MD   atorvastatin (LIPITOR) 10 MG tablet Take 1 tablet (10 mg total) by mouth daily. 09/21/22   Reubin Milan, MD  fluticasone-salmeterol (ADVAIR DISKUS) 100-50 MCG/ACT AEPB INHALE 1 DOSE BY MOUTH TWICE DAILY 03/18/23   Reubin Milan, MD  losartan (COZAAR) 50 MG tablet Take 1 tablet (50 mg total) by mouth daily. 09/21/22   Reubin Milan, MD    Family History Family History  Problem Relation Age of Onset   Hypertension Father    CAD Father 62   AAA (abdominal aortic aneurysm) Father    Asthma Mother     Social History Social History   Tobacco Use   Smoking status: Former    Packs/day: 2.00    Years: 25.00    Additional pack years: 0.00    Total pack years: 50.00    Types: Cigarettes    Quit date: 01/18/2011    Years since quitting: 12.2   Smokeless tobacco: Never  Vaping Use   Vaping Use: Never used  Substance Use Topics   Alcohol use: No    Alcohol/week: 0.0 standard drinks of alcohol   Drug use: No     Allergies   Patient has no known allergies.   Review of Systems Review of Systems   Physical Exam Triage Vital Signs ED Triage Vitals  Enc Vitals Group     BP 04/01/23 1011 (!) 135/97  Pulse Rate 04/01/23 1011 77     Resp --      Temp 04/01/23 1011 98.3 F (36.8 C)     Temp Source 04/01/23 1011 Oral     SpO2 04/01/23 1011 96 %     Weight 04/01/23 1010 205 lb (93 kg)     Height 04/01/23 1010 6' (1.829 m)     Head Circumference --      Peak Flow --      Pain Score 04/01/23 1010 5     Pain Loc --      Pain Edu? --      Excl. in GC? --    No data found.  Updated Vital Signs BP (!) 135/97 (BP Location: Left Arm)   Pulse 77   Temp 98.3 F (36.8 C) (Oral)   Ht 6' (1.829 m)   Wt 205 lb (93 kg)   SpO2 96%   BMI 27.80 kg/m   Visual Acuity Right Eye Distance:   Left Eye Distance:   Bilateral Distance:    Right Eye Near:   Left Eye Near:    Bilateral Near:     Physical Exam Constitutional:      Appearance: Normal appearance.  Eyes:      Extraocular Movements: Extraocular movements intact.  Pulmonary:     Effort: Pulmonary effort is normal.  Skin:    Comments: 1 cm laceration to the lateral aspect of the right forearm  2 cm x 1 cm x 0.5 cm laceration to the lateral aspect of the right forearm  1 cm laceration present to the lateral aspect of the right forearm  Neurological:     Mental Status: He is alert and oriented to person, place, and time.      UC Treatments / Results  Labs (all labs ordered are listed, but only abnormal results are displayed) Labs Reviewed - No data to display  EKG   Radiology No results found.  Procedures Laceration Repair  Date/Time: 04/01/2023 11:36 AM  Performed by: Valinda Hoar, NP Authorized by: Valinda Hoar, NP   Consent:    Consent obtained:  Verbal   Consent given by:  Patient   Risks discussed:  Infection Universal protocol:    Procedure explained and questions answered to patient or proxy's satisfaction: yes     Patient identity confirmed:  Verbally with patient Anesthesia:    Anesthesia method:  Local infiltration   Local anesthetic:  Lidocaine 1% w/o epi Laceration details:    Location:  Shoulder/arm   Shoulder/arm location:  R lower arm   Length (cm):  2   Depth (mm):  0.5 Pre-procedure details:    Preparation:  Patient was prepped and draped in usual sterile fashion Exploration:    Wound exploration: entire depth of wound visualized   Treatment:    Area cleansed with:  Chlorhexidine, saline and povidone-iodine   Amount of cleaning:  Standard   Irrigation solution:  Tap water   Irrigation volume:  10   Irrigation method:  Tap Skin repair:    Repair method:  Sutures   Suture size:  5-0   Suture material:  Prolene   Suture technique:  Simple interrupted   Number of sutures:  4 Approximation:    Approximation:  Close Repair type:    Repair type:  Simple Post-procedure details:    Dressing:  Non-adherent dressing   Procedure completion:   Tolerated  (including critical care time)  Medications Ordered in UC Medications - No  data to display  Initial Impression / Assessment and Plan / UC Course  I have reviewed the triage vital signs and the nursing notes.  Pertinent labs & imaging results that were available during my care of the patient were reviewed by me and considered in my medical decision making (see chart for details).  Laceration right forearm, initial encounter  Both 1 cm lacerations, adhered by skin adhesive, larger laceration, closed with sutures, 4 placed, advise removal in 10 to 14 days, advised cleansing area with diluted soap and water, pat and do not rub, may apply topical antibiotic ointment and advised covering with a nonstick bandage until healed, may use over-the-counter analgesics for management of pain and given strict precautions for any signs of infection to return for reevaluation Final Clinical Impressions(s) / UC Diagnoses   Final diagnoses:  Laceration of right forearm, initial encounter     Discharge Instructions      Last tetanus is 2016, currently up-to-date  2 lacerations have been closed by skin adhesive, allow it to fall off on its own, do not peel  4 sutures placed in middle laceration, return in 10 to 14 days for removal  May cleanse daily with soap and water, pat do not rub, may apply topical antibiotic ointment and cover with a nonstick bandage until healed  May take Tylenol 500 to 1000 mg every 6 hours and/or ibuprofen 600 to 800 mg every 6-8 hours  At any point if you begin to see increased swelling, increased redness, puslike drainage please return for reevaluation     ED Prescriptions   None    PDMP not reviewed this encounter.   Valinda Hoar, NP 04/01/23 1137

## 2023-04-01 NOTE — ED Triage Notes (Addendum)
Pt presents to UC lacertation to RT forearm w/ grinder. Last tetanus 09/30/2015

## 2023-09-14 ENCOUNTER — Other Ambulatory Visit: Payer: Self-pay | Admitting: Internal Medicine

## 2023-09-14 DIAGNOSIS — I10 Essential (primary) hypertension: Secondary | ICD-10-CM

## 2023-09-15 ENCOUNTER — Other Ambulatory Visit: Payer: Self-pay | Admitting: Internal Medicine

## 2023-09-15 DIAGNOSIS — J4520 Mild intermittent asthma, uncomplicated: Secondary | ICD-10-CM

## 2023-10-11 ENCOUNTER — Other Ambulatory Visit: Payer: Self-pay | Admitting: Internal Medicine

## 2023-10-11 DIAGNOSIS — J4520 Mild intermittent asthma, uncomplicated: Secondary | ICD-10-CM

## 2023-10-11 NOTE — Telephone Encounter (Signed)
Courtesy refill. Patient must keep upcoming office visit for further refills. Requested Prescriptions  Pending Prescriptions Disp Refills   fluticasone-salmeterol (ADVAIR) 100-50 MCG/ACT AEPB [Pharmacy Med Name: Fluticasone-Salmeterol 100-50 MCG/DOSE Inhalation Aerosol Powder Breath Activated] 60 each 0    Sig: INHALE 1 DOSE BY MOUTH TWICE DAILY     Pulmonology:  Combination Products Failed - 10/11/2023  6:51 AM      Failed - Valid encounter within last 12 months    Recent Outpatient Visits           1 year ago Annual physical exam   [place] [facility] at [facility], Nyoka Cowden, MD   1 year ago Wheezing   [place] [facility] at [facility], Nyoka Cowden, MD   2 years ago Annual physical exam   [facility] at [facility], Nyoka Cowden, MD   3 years ago Annual physical exam   [facility] at [facility], Nyoka Cowden, MD   4 years ago Annual physical exam   [facility] at [facility], Nyoka Cowden, MD       Future Appointments             In 1 week Judithann Graves, Nyoka Cowden, MD [facility] Medicine at [facility], [facility]

## 2023-10-17 NOTE — Progress Notes (Signed)
Date:  10/18/2023   Name:  Lawrence Vega   DOB:  April 10, 1966   MRN:  [id number]   Chief Complaint: Annual Exam Lawrence Vega is a 57 y.o. male who presents today for his Complete Annual Exam. He feels well. He reports exercising. He reports he is sleeping well.   Colonoscopy: 10/2018 repeat 5 yrs Dr. Servando Snare  Immunization History  Administered Date(s) Administered   Influenza,inj,Quad PF,6+ Mos 09/30/2015, 08/29/2018, 08/07/2019, 08/12/2020, 08/18/2021, 09/21/2022   Influenza-Unspecified 10/21/2017, 10/09/2023   PFIZER(Purple Top)SARS-COV-2 Vaccination 03/03/2020, 03/29/2020   PNEUMOCOCCAL CONJUGATE-20 10/18/2023   Tdap 09/30/2015   Health Maintenance Due  Topic Date Due   HIV Screening  Never done   Lung Cancer Screening  Never done   Zoster Vaccines- Shingrix (1 of 2) Never done   COVID-19 Vaccine (3 - 2023-24 season) 08/18/2023   Colonoscopy  11/01/2023    Lab Results  Component Value Date   PSA1 0.5 09/21/2022   PSA1 0.5 08/18/2021   PSA1 0.4 08/12/2020    Hypertension This is a chronic problem. The problem is controlled. Associated symptoms include shortness of breath. Pertinent negatives include no anxiety, chest pain, headaches, palpitations or peripheral edema. Past treatments include angiotensin blockers. The current treatment provides significant improvement. There is no history of kidney disease, CAD/MI or CVA.  Hyperlipidemia This is a chronic problem. The problem is controlled. Associated symptoms include shortness of breath. Pertinent negatives include no chest pain. Current antihyperlipidemic treatment includes statins. The current treatment provides significant improvement of lipids. There are no compliance problems.   Asthma He complains of chest tightness and shortness of breath. There is no sputum production. This is a recurrent problem. The problem occurs intermittently. Pertinent negatives include no chest pain or headaches. His symptoms are  alleviated by beta-agonist and steroid inhaler (change from brand to generic Advair is not as good). His past medical history is significant for asthma.    Review of Systems  Respiratory:  Positive for shortness of breath. Negative for sputum production.   Cardiovascular:  Negative for chest pain and palpitations.  Neurological:  Negative for headaches.     Lab Results  Component Value Date   NA 139 09/21/2022   K 4.4 09/21/2022   CO2 23 09/21/2022   GLUCOSE 95 09/21/2022   BUN 12 09/21/2022   CREATININE 1.07 09/21/2022   CALCIUM 9.4 09/21/2022   EGFR 82 09/21/2022   GFRNONAA 100 08/12/2020   Lab Results  Component Value Date   CHOL 162 09/21/2022   HDL 42 09/21/2022   LDLCALC 98 09/21/2022   TRIG 122 09/21/2022   CHOLHDL 3.9 09/21/2022   Lab Results  Component Value Date   TSH 0.810 09/21/2022   Lab Results  Component Value Date   HGBA1C 5.4 09/21/2022   Lab Results  Component Value Date   WBC 7.0 09/21/2022   HGB 15.9 09/21/2022   HCT 48.0 09/21/2022   MCV 85 09/21/2022   PLT 224 09/21/2022   Lab Results  Component Value Date   ALT 18 09/21/2022   AST 15 09/21/2022   ALKPHOS 100 09/21/2022   BILITOT 1.0 09/21/2022   No results found for: "25OHVITD2", "25OHVITD3", "VD25OH"   Patient Active Problem List   Diagnosis Date Noted   Reactive airway disease 09/21/2022   Benign neoplasm of ascending colon    Benign neoplasm of rectosigmoid junction    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   Benign prostatic hyperplasia 09/23/2015   Familial aortic aneurysm  09/23/2015   Mixed hyperlipidemia 09/23/2015   Tobacco use disorder, mild, in sustained remission 09/23/2015   Essential hypertension 08/31/2011    No Known Allergies  Past Surgical History:  Procedure Laterality Date   APPENDECTOMY     CATARACT EXTRACTION, BILATERAL  2019   COLONOSCOPY WITH PROPOFOL N/A 10/31/2018   Procedure: COLONOSCOPY WITH BIOPSIES;  Surgeon: Midge Minium, MD;   Location: [facility];  Service: Endoscopy;  Laterality: N/A;   FRACTURE SURGERY     HEMORRHOID SURGERY     POLYPECTOMY N/A 10/31/2018   Procedure: POLYPECTOMY;  Surgeon: Midge Minium, MD;  Location: [facility];  Service: Endoscopy;  Laterality: N/A;    Social History   Tobacco Use   Smoking status: Former    Current packs/day: 0.00    Average packs/day: 2.0 packs/day for 25.0 years (50.0 ttl pk-yrs)    Types: Cigarettes    Start date: 01/18/1986    Quit date: 01/18/2011    Years since quitting: 12.7   Smokeless tobacco: Never  Vaping Use   Vaping status: Never Used  Substance Use Topics   Alcohol use: No    Alcohol/week: 0.0 standard drinks of alcohol   Drug use: No     Medication list has been reviewed and updated.  Current Meds  Medication Sig   albuterol (VENTOLIN HFA) 108 (90 Base) MCG/ACT inhaler INHALE 2 PUFFS BY MOUTH EVERY 6 HOURS AS NEEDED FOR WHEEZING FOR SHORTNESS OF BREATH   fluticasone-salmeterol (ADVAIR) 250-50 MCG/ACT AEPB Inhale 1 puff into the lungs in the morning and at bedtime.   losartan (COZAAR) 50 MG tablet Take 1 tablet by mouth once daily   [DISCONTINUED] atorvastatin (LIPITOR) 10 MG tablet Take 1 tablet (10 mg total) by mouth daily.   [DISCONTINUED] fluticasone-salmeterol (ADVAIR) 100-50 MCG/ACT AEPB INHALE 1 DOSE BY MOUTH TWICE DAILY       10/18/2023   10:47 AM 09/21/2022    7:58 AM 08/18/2021    8:37 AM 08/12/2020    8:04 AM  GAD 7 : Generalized Anxiety Score  Nervous, Anxious, on Edge 0 0 0 0  Control/stop worrying 0 0 0 0  Worry too much - different things 0 0 0 0  Trouble relaxing 0 0 0 0  Restless 0 0 0 0  Easily annoyed or irritable 0 0 0 0  Afraid - awful might happen 0 0 0 0  Total GAD 7 Score 0 0 0 0  Anxiety Difficulty Not difficult at all Not difficult at all  Not difficult at all       10/18/2023   10:47 AM 09/21/2022    7:58 AM 08/18/2021    8:37 AM  Depression screen PHQ 2/9  Decreased Interest 0 0 0  Down,  Depressed, Hopeless 0 0 0  PHQ - 2 Score 0 0 0  Altered sleeping 0 0 0  Tired, decreased energy 0 0 0  Change in appetite 0 0 0  Feeling bad or failure about yourself  0 0 0  Trouble concentrating 0 0 0  Moving slowly or fidgety/restless 0 0 0  Suicidal thoughts 0 0 0  PHQ-9 Score 0 0 0  Difficult doing work/chores Not difficult at all Not difficult at all Not difficult at all    BP Readings from Last 3 Encounters:  10/18/23 112/70  04/01/23 (!) 135/97  09/21/22 120/76    Physical Exam Vitals and nursing note reviewed.  Constitutional:      General: He is not in  acute distress.    Appearance: Normal appearance. He is well-developed.  HENT:     Head: Normocephalic and atraumatic.     Right Ear: Tympanic membrane, ear canal and external ear normal.     Left Ear: Tympanic membrane, ear canal and external ear normal.     Nose: Nose normal.  Eyes:     Conjunctiva/sclera: Conjunctivae normal.     Pupils: Pupils are equal, round, and reactive to light.  Neck:     Thyroid: No thyromegaly.     Vascular: No carotid bruit.  Cardiovascular:     Rate and Rhythm: Normal rate and regular rhythm.     Heart sounds: Normal heart sounds.  Pulmonary:     Effort: Pulmonary effort is normal. No respiratory distress.     Breath sounds: Normal breath sounds. No wheezing.  Chest:  Breasts:    Right: No mass.     Left: No mass.  Abdominal:     General: Bowel sounds are normal.     Palpations: Abdomen is soft.     Tenderness: There is no abdominal tenderness.  Musculoskeletal:        General: Normal range of motion.     Cervical back: Normal range of motion and neck supple.     Right lower leg: No edema.     Left lower leg: No edema.  Lymphadenopathy:     Cervical: No cervical adenopathy.  Skin:    General: Skin is warm and dry.     Capillary Refill: Capillary refill takes less than 2 seconds.     Findings: No rash.  Neurological:     General: No focal deficit present.     Mental  Status: He is alert and oriented to person, place, and time.     Deep Tendon Reflexes: Reflexes are normal and symmetric.  Psychiatric:        Attention and Perception: Attention normal.        Mood and Affect: Mood normal.        Behavior: Behavior normal.        Thought Content: Thought content normal.     Wt Readings from Last 3 Encounters:  10/18/23 209 lb (94.8 kg)  04/01/23 205 lb (93 kg)  09/21/22 208 lb (94.3 kg)    BP 112/70   Pulse 62   Ht 6' (1.829 m)   Wt 209 lb (94.8 kg)   SpO2 97%   BMI 28.35 kg/m   Assessment and Plan:  Problem List Items Addressed This Visit       Unprioritized   Benign prostatic hyperplasia (Chronic)   Relevant Orders   PSA   Tobacco use disorder, mild, in sustained remission   Relevant Orders   Ambulatory Referral Lung Cancer Screening [place] [facility]   Mixed hyperlipidemia (Chronic)    LDL is  Lab Results  Component Value Date   LDLCALC 98 09/21/2022    Current regimen is atorvastatin.  Tolerating medications well without issues.       Relevant Medications   atorvastatin (LIPITOR) 10 MG tablet   Other Relevant Orders   Lipid panel   Essential hypertension (Chronic)    Controlled BP with normal exam. Current regimen is losartan. Will continue same medications; encourage continued reduced sodium diet.       Relevant Medications   atorvastatin (LIPITOR) 10 MG tablet   Other Relevant Orders   CBC with Differential/Platelet   Comprehensive metabolic panel   Reactive airway disease    Symptoms  previously well controlled on Advair and PRN albuterol but not as good since the change to generic. Will increase the dose of Advair to 250/50 - he will call if needed.      Relevant Medications   fluticasone-salmeterol (ADVAIR) 250-50 MCG/ACT AEPB   Other Relevant Orders   CBC with Differential/Platelet   Other Visit Diagnoses     Annual physical exam    -  Primary   Relevant Orders   CBC with Differential/Platelet    Comprehensive metabolic panel   Lipid panel   PSA   Urinalysis, Routine w reflex microscopic   Colon cancer screening       Relevant Orders   Ambulatory referral to Gastroenterology   Need for vaccination for pneumococcus       Relevant Orders   Pneumococcal conjugate vaccine 20-valent (Completed)       Return in about 6 months (around 04/16/2024) for HTN.    Reubin Milan, MD [facility] Medicine [place]

## 2023-10-18 ENCOUNTER — Encounter: Payer: Self-pay | Admitting: Internal Medicine

## 2023-10-18 ENCOUNTER — Ambulatory Visit (INDEPENDENT_AMBULATORY_CARE_PROVIDER_SITE_OTHER): Payer: BC Managed Care – PPO | Admitting: Internal Medicine

## 2023-10-18 VITALS — BP 112/70 | HR 62 | Ht 72.0 in | Wt 209.0 lb

## 2023-10-18 DIAGNOSIS — Z0000 Encounter for general adult medical examination without abnormal findings: Secondary | ICD-10-CM | POA: Diagnosis not present

## 2023-10-18 DIAGNOSIS — N400 Enlarged prostate without lower urinary tract symptoms: Secondary | ICD-10-CM

## 2023-10-18 DIAGNOSIS — E782 Mixed hyperlipidemia: Secondary | ICD-10-CM

## 2023-10-18 DIAGNOSIS — F17201 Nicotine dependence, unspecified, in remission: Secondary | ICD-10-CM

## 2023-10-18 DIAGNOSIS — Z23 Encounter for immunization: Secondary | ICD-10-CM

## 2023-10-18 DIAGNOSIS — Z1211 Encounter for screening for malignant neoplasm of colon: Secondary | ICD-10-CM | POA: Diagnosis not present

## 2023-10-18 DIAGNOSIS — J4520 Mild intermittent asthma, uncomplicated: Secondary | ICD-10-CM

## 2023-10-18 DIAGNOSIS — I10 Essential (primary) hypertension: Secondary | ICD-10-CM | POA: Diagnosis not present

## 2023-10-18 DIAGNOSIS — Z125 Encounter for screening for malignant neoplasm of prostate: Secondary | ICD-10-CM

## 2023-10-18 MED ORDER — ATORVASTATIN CALCIUM 10 MG PO TABS: 10.0000 mg | ORAL_TABLET | Freq: Every day | ORAL | 3 refills | Status: DC

## 2023-10-18 MED ORDER — FLUTICASONE-SALMETEROL 250-50 MCG/ACT IN AEPB
1.0000 | INHALATION_SPRAY | Freq: Two times a day (BID) | RESPIRATORY_TRACT | 3 refills | Status: DC
Start: 2023-10-18 — End: 2024-10-26

## 2023-10-18 NOTE — Assessment & Plan Note (Signed)
Controlled BP with normal exam. Current regimen is losartan. Will continue same medications; encourage continued reduced sodium diet.

## 2023-10-18 NOTE — Assessment & Plan Note (Signed)
LDL is  Lab Results  Component Value Date   LDLCALC 98 09/21/2022    Current regimen is atorvastatin.  Tolerating medications well without issues.

## 2023-10-18 NOTE — Assessment & Plan Note (Addendum)
Symptoms previously well controlled on Advair and PRN albuterol but not as good since the change to generic. Will increase the dose of Advair to 250/50 - he will call if needed.

## 2023-10-19 LAB — URINALYSIS, ROUTINE W REFLEX MICROSCOPIC
Bilirubin, UA: NEGATIVE
Glucose, UA: NEGATIVE
Ketones, UA: NEGATIVE
Leukocytes,UA: NEGATIVE
Nitrite, UA: NEGATIVE
Protein,UA: NEGATIVE
RBC, UA: NEGATIVE
Specific Gravity, UA: 1.017 (ref 1.005–1.030)
Urobilinogen, Ur: 0.2 mg/dL (ref 0.2–1.0)
pH, UA: 6.5 (ref 5.0–7.5)

## 2023-10-19 LAB — LIPID PANEL
Chol/HDL Ratio: 4.6 ratio (ref 0.0–5.0)
Cholesterol, Total: 209 mg/dL — ABNORMAL HIGH (ref 100–199)
HDL: 45 mg/dL (ref >39)
LDL Chol Calc (NIH): 131 mg/dL — ABNORMAL HIGH (ref 0–99)
Triglycerides: 185 mg/dL — ABNORMAL HIGH (ref 0–149)
VLDL Cholesterol Cal: 33 mg/dL (ref 5–40)

## 2023-10-19 LAB — COMPREHENSIVE METABOLIC PANEL
ALT: 27 [IU]/L (ref 0–44)
AST: 23 [IU]/L (ref 0–40)
Albumin: 4.6 g/dL (ref 3.8–4.9)
Alkaline Phosphatase: 108 [IU]/L (ref 44–121)
BUN/Creatinine Ratio: 16 (ref 9–20)
BUN: 17 mg/dL (ref 6–24)
Bilirubin Total: 0.8 mg/dL (ref 0.0–1.2)
CO2: 22 mmol/L (ref 20–29)
Calcium: 9.6 mg/dL (ref 8.7–10.2)
Chloride: 100 mmol/L (ref 96–106)
Creatinine, Ser: 1.07 mg/dL (ref 0.76–1.27)
Globulin, Total: 2.5 g/dL (ref 1.5–4.5)
Glucose: 90 mg/dL (ref 70–99)
Potassium: 4.8 mmol/L (ref 3.5–5.2)
Sodium: 137 mmol/L (ref 134–144)
Total Protein: 7.1 g/dL (ref 6.0–8.5)
eGFR: 82 mL/min/{1.73_m2} (ref >59)

## 2023-10-19 LAB — CBC WITH DIFFERENTIAL/PLATELET
Basophils Absolute: 0 10*3/uL (ref 0.0–0.2)
Basos: 1 %
EOS (ABSOLUTE): 0.1 10*3/uL (ref 0.0–0.4)
Eos: 1 %
Hematocrit: 52.8 % — ABNORMAL HIGH (ref 37.5–51.0)
Hemoglobin: 16.9 g/dL (ref 13.0–17.7)
Immature Grans (Abs): 0 10*3/uL (ref 0.0–0.1)
Immature Granulocytes: 1 %
Lymphocytes Absolute: 1.8 10*3/uL (ref 0.7–3.1)
Lymphs: 22 %
MCH: 28.4 pg (ref 26.6–33.0)
MCHC: 32 g/dL (ref 31.5–35.7)
MCV: 89 fL (ref 79–97)
Monocytes Absolute: 0.6 10*3/uL (ref 0.1–0.9)
Monocytes: 8 %
Neutrophils Absolute: 5.4 10*3/uL (ref 1.4–7.0)
Neutrophils: 67 %
Platelets: 250 10*3/uL (ref 150–450)
RBC: 5.96 x10E6/uL — ABNORMAL HIGH (ref 4.14–5.80)
RDW: 12.7 % (ref 11.6–15.4)
WBC: 8 10*3/uL (ref 3.4–10.8)

## 2023-10-19 LAB — PSA: Prostate Specific Ag, Serum: 0.5 ng/mL (ref 0.0–4.0)

## 2023-10-21 NOTE — Progress Notes (Signed)
Pt called. Pt aware. Pt stated he is taking his medication daily.  KP

## 2023-10-22 ENCOUNTER — Telehealth: Payer: Self-pay

## 2023-10-22 ENCOUNTER — Other Ambulatory Visit: Payer: Self-pay

## 2023-10-22 DIAGNOSIS — Z860100 Personal history of colon polyps, unspecified: Secondary | ICD-10-CM

## 2023-10-22 DIAGNOSIS — E782 Mixed hyperlipidemia: Secondary | ICD-10-CM

## 2023-10-22 MED ORDER — ATORVASTATIN CALCIUM 20 MG PO TABS
20.0000 mg | ORAL_TABLET | Freq: Every day | ORAL | 0 refills | Status: DC
Start: 2023-10-22 — End: 2024-01-17

## 2023-10-22 NOTE — Progress Notes (Signed)
Called pt left VM.  KP

## 2023-10-22 NOTE — Telephone Encounter (Signed)
Patient has requested to schedule his colonoscopy early January with Dr. Servando Snare.  I informed him that we are not scheduling for January yet, however I will call him back after Christmas to schedule.  Thanks, [place], [place]

## 2023-11-08 ENCOUNTER — Other Ambulatory Visit: Payer: Self-pay | Admitting: Internal Medicine

## 2023-11-08 DIAGNOSIS — J4520 Mild intermittent asthma, uncomplicated: Secondary | ICD-10-CM

## 2023-11-11 NOTE — Telephone Encounter (Signed)
Requested medication (s) are due for refill today: yes  Requested medication (s) are on the active medication list: date ended 10/18/23  Last refill:  10/11/23 courtesy refill   Future visit scheduled: yes  Notes to clinic:  med end date 10/10/23   Requested Prescriptions  Pending Prescriptions Disp Refills   fluticasone-salmeterol (ADVAIR) 100-50 MCG/ACT AEPB [Pharmacy Med Name: Fluticasone-Salmeterol 100-50 MCG/DOSE Inhalation Aerosol Powder Breath Activated] 60 each 0    Sig: INHALE 1 DOSE BY MOUTH TWICE DAILY     Pulmonology:  Combination Products Passed - 11/08/2023  6:51 AM      Passed - Valid encounter within last 12 months    Recent Outpatient Visits           3 weeks ago Annual physical exam   [place] [facility] at [facility], Nyoka Cowden, MD   1 year ago Annual physical exam   [facility] at [facility], Nyoka Cowden, MD   1 year ago Wheezing   [place] [facility] at [facility], Nyoka Cowden, MD   2 years ago Annual physical exam   [facility] Medicine at [facility], Nyoka Cowden, MD   3 years ago Annual physical exam   [facility] at [facility], Nyoka Cowden, MD       Future Appointments             In 4 months Judithann Graves, Nyoka Cowden, MD [facility] Primary Care & Sports Medicine at [facility], [facility]

## 2023-12-16 ENCOUNTER — Other Ambulatory Visit: Payer: Self-pay

## 2023-12-16 DIAGNOSIS — Z860100 Personal history of colon polyps, unspecified: Secondary | ICD-10-CM

## 2023-12-16 MED ORDER — NA SULFATE-K SULFATE-MG SULF 17.5-3.13-1.6 GM/177ML PO SOLN: 1.0000 | Freq: Once | ORAL | 0 refills | Status: AC

## 2023-12-16 NOTE — Telephone Encounter (Signed)
Called patient this morning left voice message for him letting him know I was touching base with him to see if he was ready to schedule his colonoscopy.  Thanks,  [place], [place]

## 2023-12-16 NOTE — Telephone Encounter (Signed)
Gastroenterology Pre-Procedure Review  Request Date: 01/10/24 Requesting Physician: Dr. Servando Snare  PATIENT REVIEW QUESTIONS: The patient responded to the following health history questions as indicated:    1. Are you having any GI issues? no 2. Do you have a personal history of Polyps? yes (last colonoscopy performed by Dr. Servando Snare 10/31/2018 recommended repeat in 5 years) 3. Do you have a family history of Colon Cancer or Polyps? no 4. Diabetes Mellitus? no 5. Joint replacements in the past 12 months?no 6. Major health problems in the past 3 months?no 7. Any artificial heart valves, MVP, or defibrillator?no    MEDICATIONS & ALLERGIES:    Patient reports the following regarding taking any anticoagulation/antiplatelet therapy:   Plavix, Coumadin, Eliquis, Xarelto, Lovenox, Pradaxa, Brilinta, or Effient? no Aspirin? no  Patient confirms/reports the following medications:  Current Outpatient Medications  Medication Sig Dispense Refill   albuterol (VENTOLIN HFA) 108 (90 Base) MCG/ACT inhaler INHALE 2 PUFFS BY MOUTH EVERY 6 HOURS AS NEEDED FOR WHEEZING FOR SHORTNESS OF BREATH 18 g 5   atorvastatin (LIPITOR) 20 MG tablet Take 1 tablet (20 mg total) by mouth daily. 90 tablet 0   fluticasone-salmeterol (ADVAIR) 250-50 MCG/ACT AEPB Inhale 1 puff into the lungs in the morning and at bedtime. 180 each 3   losartan (COZAAR) 50 MG tablet Take 1 tablet by mouth once daily 90 tablet 3   No current facility-administered medications for this visit.    Patient confirms/reports the following allergies:  No Known Allergies  No orders of the defined types were placed in this encounter.   AUTHORIZATION INFORMATION Primary Insurance: 1D#: Group #:  Secondary Insurance: 1D#: Group #:  SCHEDULE INFORMATION: Date: 01/10/24 Time: Location: [facility]

## 2023-12-16 NOTE — Addendum Note (Signed)
Addended by: Avie Arenas on: 12/16/2023 08:45 AM   Modules accepted: Orders

## 2023-12-19 ENCOUNTER — Telehealth: Payer: Self-pay

## 2023-12-19 NOTE — Telephone Encounter (Signed)
 Contacted patient LVM to let him know that Dr. Servando Snare will no longer be doing procedures at [facility] and his procedure location has been changed to [facility] on 01/10/24.  Instructions updated.  Referral updated.   Thanks, [place], [place]

## 2024-01-10 ENCOUNTER — Ambulatory Visit: Payer: BC Managed Care – PPO | Admitting: Registered Nurse

## 2024-01-10 ENCOUNTER — Other Ambulatory Visit: Payer: Self-pay

## 2024-01-10 ENCOUNTER — Encounter: Payer: Self-pay | Admitting: Gastroenterology

## 2024-01-10 ENCOUNTER — Encounter
Admission: RE | Disposition: A | Discharge status: Home / Self Care | Payer: Self-pay | Source: Home / Self Care | Attending: Gastroenterology

## 2024-01-10 ENCOUNTER — Ambulatory Visit
Admission: RE | Admit: 2024-01-10 | Discharge: 2024-01-10 | Disposition: A | Discharge status: Home / Self Care | Payer: BC Managed Care – PPO | Attending: Gastroenterology | Admitting: Gastroenterology

## 2024-01-10 DIAGNOSIS — Z8249 Family history of ischemic heart disease and other diseases of the circulatory system: Secondary | ICD-10-CM | POA: Diagnosis not present

## 2024-01-10 DIAGNOSIS — Z87891 Personal history of nicotine dependence: Secondary | ICD-10-CM | POA: Insufficient documentation

## 2024-01-10 DIAGNOSIS — K640 First degree hemorrhoids: Secondary | ICD-10-CM | POA: Diagnosis not present

## 2024-01-10 DIAGNOSIS — K635 Polyp of colon: Secondary | ICD-10-CM | POA: Insufficient documentation

## 2024-01-10 DIAGNOSIS — K5730 Diverticulosis of large intestine without perforation or abscess without bleeding: Secondary | ICD-10-CM | POA: Insufficient documentation

## 2024-01-10 DIAGNOSIS — I10 Essential (primary) hypertension: Secondary | ICD-10-CM | POA: Diagnosis not present

## 2024-01-10 DIAGNOSIS — Z1211 Encounter for screening for malignant neoplasm of colon: Secondary | ICD-10-CM | POA: Insufficient documentation

## 2024-01-10 DIAGNOSIS — Z79899 Other long term (current) drug therapy: Secondary | ICD-10-CM | POA: Diagnosis not present

## 2024-01-10 DIAGNOSIS — D122 Benign neoplasm of ascending colon: Secondary | ICD-10-CM | POA: Insufficient documentation

## 2024-01-10 DIAGNOSIS — Z860100 Personal history of colon polyps, unspecified: Secondary | ICD-10-CM

## 2024-01-10 DIAGNOSIS — Z860101 Personal history of adenomatous and serrated colon polyps: Secondary | ICD-10-CM | POA: Diagnosis present

## 2024-01-10 HISTORY — PX: COLONOSCOPY WITH PROPOFOL: SHX5780

## 2024-01-10 HISTORY — PX: POLYPECTOMY: SHX5525

## 2024-01-10 SURGERY — COLONOSCOPY WITH PROPOFOL
Anesthesia: General

## 2024-01-10 MED ORDER — LIDOCAINE HCL (PF) 2 % IJ SOLN
INTRAMUSCULAR | Status: AC
  Filled 2024-01-10: qty 5

## 2024-01-10 MED ORDER — LIDOCAINE HCL (CARDIAC) PF 100 MG/5ML IV SOSY
PREFILLED_SYRINGE | INTRAVENOUS | Status: DC | PRN
  Administered 2024-01-10: 50 mg via INTRAVENOUS

## 2024-01-10 MED ORDER — SODIUM CHLORIDE 0.9 % IV SOLN: INTRAVENOUS | Status: DC

## 2024-01-10 MED ORDER — PROPOFOL 1000 MG/100ML IV EMUL
INTRAVENOUS | Status: AC
  Filled 2024-01-10: qty 100

## 2024-01-10 MED ORDER — PROPOFOL 10 MG/ML IV BOLUS
INTRAVENOUS | Status: DC | PRN
  Administered 2024-01-10: 70 mg via INTRAVENOUS
  Administered 2024-01-10: 10 mg via INTRAVENOUS
  Administered 2024-01-10: 30 mg via INTRAVENOUS
  Administered 2024-01-10: 100 ug/kg/min via INTRAVENOUS
  Administered 2024-01-10: 30 mg via INTRAVENOUS

## 2024-01-10 NOTE — Anesthesia Postprocedure Evaluation (Signed)
Anesthesia Post Note  Patient: Lawrence Vega  Procedure(s) Performed: COLONOSCOPY WITH PROPOFOL  Patient location during evaluation: PACU Anesthesia Type: General Level of consciousness: awake and awake and alert Pain management: satisfactory to patient Vital Signs Assessment: post-procedure vital signs reviewed and stable Respiratory status: spontaneous breathing Cardiovascular status: blood pressure returned to baseline Anesthetic complications: no   No notable events documented.   Last Vitals:  Vitals:   01/10/24 0828 01/10/24 0829  BP: 95/72 94/79  Pulse: 74 74  Resp: 14 13  Temp: (!) 36.1 C   SpO2: 96% 97%    Last Pain:  Vitals:   01/10/24 0828  TempSrc:   PainSc: 0-No pain                 VAN STAVEREN,Earsie Humm

## 2024-01-10 NOTE — Anesthesia Procedure Notes (Signed)
Procedure Name: MAC Date/Time: 01/10/2024 8:10 AM  Performed by: Lily Lovings, CRNAPre-anesthesia Checklist: Patient identified, Emergency Drugs available, Suction available and Patient being monitored Patient Re-evaluated:Patient Re-evaluated prior to induction Oxygen Delivery Method: Simple face mask Preoxygenation: Pre-oxygenation with 100% oxygen Induction Type: IV induction Comments: pom

## 2024-01-10 NOTE — Anesthesia Preprocedure Evaluation (Signed)
Anesthesia Evaluation  Patient identified by MRN, date of birth, ID band Patient awake    Reviewed: Allergy & Precautions, NPO status , Patient's Chart, lab work & pertinent test results  Airway Mallampati: II  TM Distance: >3 FB Neck ROM: full    Dental  (+) Upper Dentures, Lower Dentures   Pulmonary neg pulmonary ROS, Patient abstained from smoking., former smoker   Pulmonary exam normal        Cardiovascular Exercise Tolerance: Good hypertension, Pt. on medications negative cardio ROS Normal cardiovascular exam Rhythm:Regular Rate:Normal     Neuro/Psych negative neurological ROS  negative psych ROS   GI/Hepatic negative GI ROS, Neg liver ROS,,,  Endo/Other  negative endocrine ROS    Renal/GU negative Renal ROS  negative genitourinary   Musculoskeletal negative musculoskeletal ROS (+)    Abdominal Normal abdominal exam  (+)   Peds  Hematology negative hematology ROS (+)   Anesthesia Other Findings Past Medical History: No date: BPH (benign prostatic hyperplasia) No date: Encounter for screening colonoscopy No date: Heartburn No date: Hypertension No date: Wheezing  Past Surgical History: No date: APPENDECTOMY 2019: CATARACT EXTRACTION, BILATERAL 10/31/2018: COLONOSCOPY WITH PROPOFOL; N/A     Comment:  Procedure: COLONOSCOPY WITH BIOPSIES;  Surgeon: Midge Minium, MD;  Location: [facility];  Service:               Endoscopy;  Laterality: N/A; No date: FRACTURE SURGERY; Left     Comment:  left arm and wrist No date: HEMORRHOID SURGERY 10/31/2018: POLYPECTOMY; N/A     Comment:  Procedure: POLYPECTOMY;  Surgeon: Midge Minium, MD;                Location: [facility];  Service: Endoscopy;                Laterality: N/A;  BMI    Body Mass Index: 27.94 kg/m      Reproductive/Obstetrics negative OB ROS                             Anesthesia  Physical Anesthesia Plan  ASA: 2  Anesthesia Plan: General   Post-op Pain Management:    Induction: Intravenous  PONV Risk Score and Plan: Propofol infusion and TIVA  Airway Management Planned: Natural Airway and Nasal Cannula  Additional Equipment:   Intra-op Plan:   Post-operative Plan:   Informed Consent: I have reviewed the patients History and Physical, chart, labs and discussed the procedure including the risks, benefits and alternatives for the proposed anesthesia with the patient or authorized representative who has indicated his/her understanding and acceptance.     Dental Advisory Given  Plan Discussed with: CRNA  Anesthesia Plan Comments:        Anesthesia Quick Evaluation

## 2024-01-10 NOTE — Op Note (Signed)
[facility] Patient Name: Lawrence Vega Procedure Date: 01/10/2024 7:10 AM MRN: [id number] Account #: [id number] Date of Birth: Dec 17, 1966 Admit Type: Outpatient Age: 58 Room: [facility] ENDO ROOM 4 Gender: Male Note Status: Finalized Instrument Name: Prentice Docker [id number] Procedure:             Colonoscopy Indications:           High risk colon cancer surveillance: Personal history                         of colonic polyps Providers:             Midge Minium MD, MD Referring MD:          Bari Edward, MD (Referring MD) Medicines:             Propofol per Anesthesia Complications:         No immediate complications. Procedure:             Pre-Anesthesia Assessment:                        - Prior to the procedure, a History and Physical was                         performed, and patient medications and allergies were                         reviewed. The patient's tolerance of previous                         anesthesia was also reviewed. The risks and benefits                         of the procedure and the sedation options and risks                         were discussed with the patient. All questions were                         answered, and informed consent was obtained. Prior                         Anticoagulants: The patient has taken no anticoagulant                         or antiplatelet agents. ASA Grade Assessment: II - A                         patient with mild systemic disease. After reviewing                         the risks and benefits, the patient was deemed in                         satisfactory condition to undergo the procedure.                        After obtaining informed consent, the colonoscope was  passed under direct vision. Throughout the procedure,                         the patient's blood pressure, pulse, and oxygen                         saturations were monitored continuously. The                          Colonoscope was introduced through the anus and                         advanced to the the cecum, identified by appendiceal                         orifice and ileocecal valve. The colonoscopy was                         performed without difficulty. The patient tolerated                         the procedure well. The quality of the bowel                         preparation was good. Findings:      The perianal and digital rectal examinations were normal.      An 8 mm polyp was found in the ascending colon. The polyp was sessile.       The polyp was removed with a cold snare. Resection and retrieval were       complete.      A 3 mm polyp was found in the sigmoid colon. The polyp was sessile. The       polyp was removed with a cold snare. Resection and retrieval were       complete.      Non-bleeding internal hemorrhoids were found during retroflexion. The       hemorrhoids were Grade I (internal hemorrhoids that do not prolapse).      Multiple small-mouthed diverticula were found in the sigmoid colon. Impression:            - One 8 mm polyp in the ascending colon, removed with                         a cold snare. Resected and retrieved.                        - One 3 mm polyp in the sigmoid colon, removed with a                         cold snare. Resected and retrieved.                        - Non-bleeding internal hemorrhoids.                        - Diverticulosis in the sigmoid colon. Recommendation:        - Discharge patient to home.                        -  Resume previous diet.                        - Continue present medications.                        - Await pathology results.                        - Repeat colonoscopy in 7 years for surveillance. Procedure Code(s):     --- Professional ---                        [phone], Colonoscopy, flexible; with removal of                         tumor(s), polyp(s), or other lesion(s) by snare                          technique Diagnosis Code(s):     --- Professional ---                        Z86.010, Personal history of colonic polyps                        D12.5, Benign neoplasm of sigmoid colon CPT copyright 2022 American Medical Association. All rights reserved. The codes documented in this report are preliminary and upon coder review may  be revised to meet current compliance requirements. Midge Minium MD, MD 01/10/2024 8:26:10 AM This report has been signed electronically. Number of Addenda: 0 Note Initiated On: 01/10/2024 7:10 AM Scope Withdrawal Time: 0 hours 6 minutes 56 seconds  Total Procedure Duration: 0 hours 10 minutes 54 seconds  Estimated Blood Loss:  Estimated blood loss: none.      [facility]

## 2024-01-10 NOTE — H&P (Signed)
Midge Minium, MD [facility] [address] [place], [place] [address] Phone:[phone] Fax : [phone]  Primary Care Physician:  Reubin Milan, MD Primary Gastroenterologist:  Dr. Servando Snare  Pre-Procedure History & Physical: HPI:  Lawrence Vega is a 58 y.o. male is here for an colonoscopy.   Past Medical History:  Diagnosis Date   BPH (benign prostatic hyperplasia)    Encounter for screening colonoscopy    Heartburn    Hypertension    Wheezing     Past Surgical History:  Procedure Laterality Date   APPENDECTOMY     CATARACT EXTRACTION, BILATERAL  2019   COLONOSCOPY WITH PROPOFOL N/A 10/31/2018   Procedure: COLONOSCOPY WITH BIOPSIES;  Surgeon: Midge Minium, MD;  Location: [facility];  Service: Endoscopy;  Laterality: N/A;   FRACTURE SURGERY Left    left arm and wrist   HEMORRHOID SURGERY     POLYPECTOMY N/A 10/31/2018   Procedure: POLYPECTOMY;  Surgeon: Midge Minium, MD;  Location: [facility];  Service: Endoscopy;  Laterality: N/A;    Prior to Admission medications   Medication Sig Start Date End Date Taking? Authorizing Provider  albuterol (VENTOLIN HFA) 108 (90 Base) MCG/ACT inhaler INHALE 2 PUFFS BY MOUTH EVERY 6 HOURS AS NEEDED FOR WHEEZING FOR SHORTNESS OF BREATH 09/21/22  Yes Reubin Milan, MD  atorvastatin (LIPITOR) 20 MG tablet Take 1 tablet (20 mg total) by mouth daily. 10/22/23  Yes Reubin Milan, MD  fluticasone-salmeterol (ADVAIR) 250-50 MCG/ACT AEPB Inhale 1 puff into the lungs in the morning and at bedtime. 10/18/23  Yes Reubin Milan, MD  losartan (COZAAR) 50 MG tablet Take 1 tablet by mouth once daily 09/15/23  Yes Reubin Milan, MD    Allergies as of 12/16/2023   (No Known Allergies)    Family History  Problem Relation Age of Onset   Hypertension Father    CAD Father 20   AAA (abdominal aortic aneurysm) Father    Asthma Mother     Social History   Socioeconomic History   Marital status: Married     Spouse name: Not on file   Number of children: Not on file   Years of education: Not on file   Highest education level: Not on file  Occupational History   Not on file  Tobacco Use   Smoking status: Former    Current packs/day: 0.00    Average packs/day: 2.0 packs/day for 25.0 years (50.0 ttl pk-yrs)    Types: Cigarettes    Start date: 01/18/1986    Quit date: 01/18/2011    Years since quitting: 12.9   Smokeless tobacco: Never  Vaping Use   Vaping status: Never Used  Substance and Sexual Activity   Alcohol use: No    Alcohol/week: 0.0 standard drinks of alcohol   Drug use: No   Sexual activity: Not on file  Other Topics Concern   Not on file  Social History Narrative   Not on file   Social Drivers of Health   Financial Resource Strain: Not on file  Food Insecurity: Not on file  Transportation Needs: Not on file  Physical Activity: Not on file  Stress: Not on file (10/23/2023)  Social Connections: Not on file  Intimate Partner Violence: Low Risk  (04/01/2020)   Received from [facility]   Intimate Partner Violence    Insults You: Not on file    Threatens You: Not on file    Screams at You: Not on file  Physically Hurt: Not on file    Intimate Partner Violence Score: Not on file    Review of Systems: See HPI, otherwise negative ROS  Physical Exam: BP 124/83   Pulse 63   Temp (!) 97.3 F (36.3 C) (Temporal)   Resp 16   Ht 6' (1.829 m)   Wt 93.4 kg   SpO2 96%   BMI 27.94 kg/m  General:   Alert,  pleasant and cooperative in NAD Head:  Normocephalic and atraumatic. Neck:  Supple; no masses or thyromegaly. Lungs:  Clear throughout to auscultation.    Heart:  Regular rate and rhythm. Abdomen:  Soft, nontender and nondistended. Normal bowel sounds, without guarding, and without rebound.   Neurologic:  Alert and  oriented x4;  grossly normal neurologically.  Impression/Plan: Lawrence Vega is here for an colonoscopy to be performed for a history of  adenomatous polyps on 2019   Risks, benefits, limitations, and alternatives regarding  colonoscopy have been reviewed with the patient.  Questions have been answered.  All parties agreeable.   Midge Minium, MD  01/10/2024, 8:06 AM

## 2024-01-10 NOTE — Transfer of Care (Signed)
Immediate Anesthesia Transfer of Care Note  Patient: Lawrence Vega  Procedure(s) Performed: COLONOSCOPY WITH PROPOFOL  Patient Location: Endoscopy Unit  Anesthesia Type:General  Level of Consciousness: drowsy and patient cooperative  Airway & Oxygen Therapy: Patient Spontanous Breathing  Post-op Assessment: Report given to RN and Patient moving all extremities  Post vital signs: Reviewed and stable  Last Vitals:  Vitals Value Taken Time  BP 94/79 01/10/24 0829  Temp    Pulse 77 01/10/24 0829  Resp 13 01/10/24 0829  SpO2 97 % 01/10/24 0829  Vitals shown include unfiled device data.  Last Pain:  Vitals:   01/10/24 0709  TempSrc: Temporal  PainSc: 0-No pain         Complications: No notable events documented.

## 2024-01-13 ENCOUNTER — Encounter: Payer: Self-pay | Admitting: Gastroenterology

## 2024-01-13 LAB — SURGICAL PATHOLOGY

## 2024-01-15 ENCOUNTER — Other Ambulatory Visit: Payer: Self-pay

## 2024-01-15 ENCOUNTER — Telehealth: Payer: Self-pay | Admitting: Acute Care

## 2024-01-15 DIAGNOSIS — Z87891 Personal history of nicotine dependence: Secondary | ICD-10-CM

## 2024-01-15 DIAGNOSIS — Z122 Encounter for screening for malignant neoplasm of respiratory organs: Secondary | ICD-10-CM

## 2024-01-15 NOTE — Telephone Encounter (Signed)
Lung Cancer Screening Narrative/Criteria Questionnaire (Cigarette Smokers Only- No Cigars/Pipes/vapes)   Lawrence Vega   SDMV:02/11/24 at 1pm w/ [employer]                                           08-23-1966                 LDCT: 02/14/24 at 0900am at [facility]    58 y.o.   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 It is called a shared decision making visit. The initial meeting is required by insurance and Medicare to make sure you understand the program. This appointment takes about 15-20 minutes to complete. The CT scan will completed at a separate date/time. This scan takes about 5-10 minutes to complete and you may eat and drink before and after the scan.  Criteria questions for Lung Cancer Screening:   Are you a current or former smoker? Former Age began smoking: 58 yo   If you are a former smoker, what year did you quit smoking? {2012 (within 15 yrs)   To calculate your smoking history, I need an accurate estimate of how many packs of cigarettes you smoked per day and for how many years. (Not just the number of PPD you are now smoking)   Years smoking 29 x Packs per day 1.5 = Pack years 44   (at least 20 pack yrs)   (Make sure they understand that we need to know how much they have smoked in the past, not just the number of PPD they are smoking now)  Do you have a personal history of cancer?  No    Do you have a family history of cancer? No  Are you coughing up blood?  No  Have you had unexplained weight loss of 15 lbs or more in the last 6 months? No  It looks like you meet all criteria.     Additional information: N/A

## 2024-01-17 ENCOUNTER — Other Ambulatory Visit: Payer: Self-pay | Admitting: Internal Medicine

## 2024-01-17 DIAGNOSIS — E782 Mixed hyperlipidemia: Secondary | ICD-10-CM

## 2024-01-17 NOTE — Telephone Encounter (Signed)
Requested Prescriptions  Pending Prescriptions Disp Refills   atorvastatin (LIPITOR) 20 MG tablet [Pharmacy Med Name: Atorvastatin Calcium 20 MG Oral Tablet] 90 tablet 0    Sig: Take 1 tablet by mouth once daily     Cardiovascular:  Antilipid - Statins Failed - 01/17/2024  3:04 PM      Failed - Lipid Panel in normal range within the last 12 months    Cholesterol, Total  Date Value Ref Range Status  10/18/2023 209 (H) 100 - 199 mg/dL Final   LDL Chol Calc (NIH)  Date Value Ref Range Status  10/18/2023 131 (H) 0 - 99 mg/dL Final   HDL  Date Value Ref Range Status  10/18/2023 45 >39 mg/dL Final   Triglycerides  Date Value Ref Range Status  10/18/2023 185 (H) 0 - 149 mg/dL Final         Passed - Patient is not pregnant      Passed - Valid encounter within last 12 months    Recent Outpatient Visits           3 months ago Annual physical exam   [facility] at [facility], Nyoka Cowden, MD   1 year ago Annual physical exam   [facility] at [facility], Nyoka Cowden, MD   1 year ago Wheezing   [place] [facility] at [facility], Nyoka Cowden, MD   2 years ago Annual physical exam   [facility] at [facility], Nyoka Cowden, MD   3 years ago Annual physical exam   [facility] at [facility], Nyoka Cowden, MD       Future Appointments             In 2 months Judithann Graves, Nyoka Cowden, MD [facility] at [facility]

## 2024-02-11 ENCOUNTER — Ambulatory Visit (INDEPENDENT_AMBULATORY_CARE_PROVIDER_SITE_OTHER): Payer: BC Managed Care – PPO | Admitting: Acute Care

## 2024-02-11 DIAGNOSIS — Z87891 Personal history of nicotine dependence: Secondary | ICD-10-CM

## 2024-02-11 NOTE — Patient Instructions (Signed)

## 2024-02-11 NOTE — Progress Notes (Signed)
 Provider Attestation I agree with the documentation of the Shared Decision Making visit,  smoking cessation counseling if appropriate, and verification or eligibility for lung cancer screening as documented by the RN Nurse Navigator.   Raejean Bullock, MSN, AGACNP-BC [facility] See Amion for personal pager PCCM on call pager [phone]     Virtual Visit via Video Note  I connected with Maryfrances Snell on 02/11/24 at  1:00 PM EST by a video enabled telemedicine application and verified that I am speaking with the correct person using two identifiers.  Location: Patient: in home Provider: [address], [place], [place], [address]    Shared Decision Making Visit Lung Cancer Screening Program [phone])   Eligibility: Age 58 y.o. Pack Years Smoking History Calculation 44 (# packs/per year x # years smoked) Recent History of coughing up blood  no Unexplained weight loss? no ( >Than 15 pounds within the last 6 months ) Prior History Lung / other cancer no (Diagnosis within the last 5 years already requiring surveillance chest CT Scans). Smoking Status Former Smoker Former Smokers: Years since quit: 13 years  Quit Date: 01-18-11  Visit Components: Discussion included one or more decision making aids. yes Discussion included risk/benefits of screening. yes Discussion included potential follow up diagnostic testing for abnormal scans. yes Discussion included meaning and risk of over diagnosis. yes Discussion included meaning and risk of False Positives. yes Discussion included meaning of total radiation exposure. yes  Counseling Included: Importance of adherence to annual lung cancer LDCT screening. yes Impact of comorbidities on ability to participate in the program. yes Ability and willingness to under diagnostic treatment. yes  Smoking Cessation Counseling: Current Smokers:  Discussed importance of smoking cessation. yes Information  about tobacco cessation classes and interventions provided to patient. yes Patient provided with "ticket" for LDCT Scan. yes Symptomatic Patient. no  Counseling NA Diagnosis Code: Tobacco Use Z72.0 Asymptomatic Patient yes  Counseling (Intermediate counseling: > three minutes counseling) [id number] Former Smokers:  Discussed the importance of maintaining cigarette abstinence. yes Diagnosis Code: Personal History of Nicotine Dependence. O13.086 Information about tobacco cessation classes and interventions provided to patient. Yes Patient provided with "ticket" for LDCT Scan. yes Written Order for Lung Cancer Screening with LDCT placed in Epic. Yes (CT Chest Lung Cancer Screening Low Dose W/O CM) [id number] Z12.2-Screening of respiratory organs Z87.891-Personal history of nicotine dependence   Valentin Gaskins, RN 02/11/24

## 2024-02-14 ENCOUNTER — Ambulatory Visit
Admission: RE | Admit: 2024-02-14 | Discharge: 2024-02-14 | Disposition: A | Discharge status: Home / Self Care | Payer: BC Managed Care – PPO | Source: Ambulatory Visit | Attending: Acute Care | Admitting: Acute Care

## 2024-02-14 DIAGNOSIS — Z122 Encounter for screening for malignant neoplasm of respiratory organs: Secondary | ICD-10-CM | POA: Diagnosis present

## 2024-02-14 DIAGNOSIS — I700 Atherosclerosis of aorta: Secondary | ICD-10-CM | POA: Insufficient documentation

## 2024-02-14 DIAGNOSIS — J479 Bronchiectasis, uncomplicated: Secondary | ICD-10-CM | POA: Insufficient documentation

## 2024-02-14 DIAGNOSIS — Z87891 Personal history of nicotine dependence: Secondary | ICD-10-CM | POA: Diagnosis present

## 2024-02-14 DIAGNOSIS — J439 Emphysema, unspecified: Secondary | ICD-10-CM | POA: Insufficient documentation

## 2024-03-09 ENCOUNTER — Telehealth: Payer: Self-pay | Admitting: Internal Medicine

## 2024-03-09 ENCOUNTER — Encounter: Payer: Self-pay | Admitting: Internal Medicine

## 2024-03-09 NOTE — Telephone Encounter (Signed)
 Please review and advise.

## 2024-03-09 NOTE — Telephone Encounter (Signed)
 Please review

## 2024-03-09 NOTE — Telephone Encounter (Unsigned)
 Copied from CRM [id number]. Topic: Clinical - Lab/Test Results >> Mar 09, 2024 11:29 AM Gildardo Pounds wrote: Reason for CRM: Patient calling for results of lung scan done in February 2025. Callback number is [phone]

## 2024-03-10 ENCOUNTER — Telehealth: Payer: Self-pay | Admitting: Internal Medicine

## 2024-03-10 ENCOUNTER — Telehealth: Payer: Self-pay | Admitting: *Deleted

## 2024-03-10 ENCOUNTER — Other Ambulatory Visit: Payer: Self-pay | Admitting: Internal Medicine

## 2024-03-10 DIAGNOSIS — J4520 Mild intermittent asthma, uncomplicated: Secondary | ICD-10-CM

## 2024-03-10 NOTE — Telephone Encounter (Signed)
 Called pt. Pt aware. KP

## 2024-03-10 NOTE — Telephone Encounter (Signed)
 Call report from [facility]:   IMPRESSION: Lung-RADS 4A, suspicious. Follow up low-dose chest CT without contrast in 3 months (please use the following order, "CT CHEST LCS NODULE FOLLOW-UP W/O CM") is recommended. Nodular 12.6 mm focus of consolidation in the subpleural posteromedial basilar right lower lobe with associated mild cylindrical bronchiectasis and mild patchy tree-in-bud opacities, favoring infectious or inflammatory nodule.   Aortic Atherosclerosis (ICD10-I70.0) and Emphysema (ICD10-J43.9).

## 2024-03-10 NOTE — Telephone Encounter (Signed)
 Copied from CRM [id number]. Topic: Referral - Request for Referral >> Mar 10, 2024  8:59 AM Lawrence Vega wrote: Did the patient discuss referral with their provider in the last year? Yes (If No - schedule appointment) (If Yes - send message)  Appointment offered? No  Type of order/referral and detailed reason for visit: Shortness of breath - COPD  Preference of office, provider, location: Lawrence Hacking, MD; Pulmonologist in [place], [place] # [phone]  If referral order, have you been seen by this specialty before? No (If Yes, this issue or another issue? When? Where?  Can we respond through MyChart? Yes

## 2024-03-10 NOTE — Telephone Encounter (Signed)
 Please review

## 2024-03-10 NOTE — Progress Notes (Unsigned)
 Date:  03/10/2024   Name:  Lawrence Vega   DOB:  March 27, 1966   MRN:  [id number]   Chief Complaint: No chief complaint on file.  HPI  Review of Systems   Lab Results  Component Value Date   NA 137 10/18/2023   K 4.8 10/18/2023   CO2 22 10/18/2023   GLUCOSE 90 10/18/2023   BUN 17 10/18/2023   CREATININE 1.07 10/18/2023   CALCIUM 9.6 10/18/2023   EGFR 82 10/18/2023   GFRNONAA 100 08/12/2020   Lab Results  Component Value Date   CHOL 209 (H) 10/18/2023   HDL 45 10/18/2023   LDLCALC 131 (H) 10/18/2023   TRIG 185 (H) 10/18/2023   CHOLHDL 4.6 10/18/2023   Lab Results  Component Value Date   TSH 0.810 09/21/2022   Lab Results  Component Value Date   HGBA1C 5.4 09/21/2022   Lab Results  Component Value Date   WBC 8.0 10/18/2023   HGB 16.9 10/18/2023   HCT 52.8 (H) 10/18/2023   MCV 89 10/18/2023   PLT 250 10/18/2023   Lab Results  Component Value Date   ALT 27 10/18/2023   AST 23 10/18/2023   ALKPHOS 108 10/18/2023   BILITOT 0.8 10/18/2023   No results found for: "25OHVITD2", "25OHVITD3", "VD25OH"   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   Mixed hyperlipidemia 09/23/2015   Tobacco use disorder, mild, in sustained remission 09/23/2015   Essential hypertension 08/31/2011    No Known Allergies  Past Surgical History:  Procedure Laterality Date   APPENDECTOMY     CATARACT EXTRACTION, BILATERAL  2019   COLONOSCOPY WITH PROPOFOL N/A 10/31/2018   Procedure: COLONOSCOPY WITH BIOPSIES;  Surgeon: Midge Minium, MD;  Location: [facility];  Service: Endoscopy;  Laterality: N/A;   COLONOSCOPY WITH PROPOFOL N/A  01/10/2024   Procedure: COLONOSCOPY WITH PROPOFOL;  Surgeon: Midge Minium, MD;  Location: [facility] ENDOSCOPY;  Service: Endoscopy;  Laterality: N/A;   FRACTURE SURGERY Left    left arm and wrist   HEMORRHOID SURGERY     POLYPECTOMY N/A 10/31/2018   Procedure: POLYPECTOMY;  Surgeon: Midge Minium, MD;  Location: [facility];  Service: Endoscopy;  Laterality: N/A;   POLYPECTOMY  01/10/2024   Procedure: POLYPECTOMY;  Surgeon: Midge Minium, MD;  Location: [facility] ENDOSCOPY;  Service: Endoscopy;;    Social History   Tobacco Use   Smoking status: Former    Current packs/day: 0.00    Average packs/day: 2.0 packs/day for 25.0 years (50.0 ttl pk-yrs)    Types: Cigarettes    Start date: 01/18/1986    Quit date: 01/18/2011    Years since quitting: 13.1   Smokeless tobacco: Never  Vaping Use   Vaping status: Never Used  Substance Use Topics   Alcohol use: No    Alcohol/week: 0.0 standard drinks of alcohol   Drug use: No     Medication list has been reviewed and updated.  No outpatient medications have been marked as taking for the 03/10/24 encounter (Orders Only) with Reubin Milan, MD.       10/18/2023   10:47 AM 09/21/2022    7:58 AM 08/18/2021  8:37 AM 08/12/2020    8:04 AM  GAD 7 : Generalized Anxiety Score  Nervous, Anxious, on Edge 0 0 0 0  Control/stop worrying 0 0 0 0  Worry too much - different things 0 0 0 0  Trouble relaxing 0 0 0 0  Restless 0 0 0 0  Easily annoyed or irritable 0 0 0 0  Afraid - awful might happen 0 0 0 0  Total GAD 7 Score 0 0 0 0  Anxiety Difficulty Not difficult at all Not difficult at all  Not difficult at all       10/18/2023   10:47 AM 09/21/2022    7:58 AM 08/18/2021    8:37 AM  Depression screen PHQ 2/9  Decreased Interest 0 0 0  Down, Depressed, Hopeless 0 0 0  PHQ - 2 Score 0 0 0  Altered sleeping 0 0 0  Tired, decreased energy 0 0 0  Change in appetite 0 0 0  Feeling bad or failure about yourself  0 0 0  Trouble concentrating 0 0 0   Moving slowly or fidgety/restless 0 0 0  Suicidal thoughts 0 0 0  PHQ-9 Score 0 0 0  Difficult doing work/chores Not difficult at all Not difficult at all Not difficult at all    BP Readings from Last 3 Encounters:  01/10/24 116/86  10/18/23 112/70  04/01/23 (!) 135/97    Physical Exam  Wt Readings from Last 3 Encounters:  01/10/24 206 lb (93.4 kg)  10/18/23 209 lb (94.8 kg)  04/01/23 205 lb (93 kg)    There were no vitals taken for this visit.  Assessment and Plan:  Problem List Items Addressed This Visit   None   No follow-ups on file.    Reubin Milan, MD [facility] [place]

## 2024-03-12 ENCOUNTER — Other Ambulatory Visit: Payer: Self-pay

## 2024-03-12 DIAGNOSIS — R911 Solitary pulmonary nodule: Secondary | ICD-10-CM

## 2024-03-12 DIAGNOSIS — Z87891 Personal history of nicotine dependence: Secondary | ICD-10-CM

## 2024-03-12 DIAGNOSIS — Z122 Encounter for screening for malignant neoplasm of respiratory organs: Secondary | ICD-10-CM

## 2024-03-12 DIAGNOSIS — F17210 Nicotine dependence, cigarettes, uncomplicated: Secondary | ICD-10-CM

## 2024-03-12 NOTE — Telephone Encounter (Signed)
 Called and spoke with patient. Reviewed recent CT results. Advised his CT was read as a 4A due to the 12.105mm nodule. He denies any recent illness. He is an agreement to a 3 month scan that we have scheduled for 05/15/2024 at [facility]. Aortic Atherosclerosis and Emphysema also reviewed. He is taking a statin. CT results and plan sent to PCP. 3 Month order placed.

## 2024-03-12 NOTE — Telephone Encounter (Signed)
 Patient had LDCT on 02/14/2024 that resulted on 03/09/2024 as a 4A due to the 12.62mm nodule. Kandice Robinsons NP has reviewed results and agrees with recommendations for patient to have a 3 month follow up scan as the results favor infection/inflammation. Follow up scan due 05/14/2024. Will need to call patient and discuss results and recommendations.

## 2024-03-19 ENCOUNTER — Ambulatory Visit (INDEPENDENT_AMBULATORY_CARE_PROVIDER_SITE_OTHER): Admitting: Family Medicine

## 2024-03-19 ENCOUNTER — Encounter: Payer: Self-pay | Admitting: Family Medicine

## 2024-03-19 VITALS — BP 116/70 | HR 85 | Ht 72.0 in | Wt 200.2 lb

## 2024-03-19 DIAGNOSIS — J0100 Acute maxillary sinusitis, unspecified: Secondary | ICD-10-CM

## 2024-03-19 DIAGNOSIS — J441 Chronic obstructive pulmonary disease with (acute) exacerbation: Secondary | ICD-10-CM

## 2024-03-19 LAB — POC COVID19 BINAXNOW: SARS Coronavirus 2 Ag: NEGATIVE

## 2024-03-19 LAB — POCT INFLUENZA A/B
Influenza A, POC: NEGATIVE
Influenza B, POC: NEGATIVE

## 2024-03-19 MED ORDER — AMOXICILLIN-POT CLAVULANATE 875-125 MG PO TABS: 1.0000 | ORAL_TABLET | Freq: Two times a day (BID) | ORAL | 0 refills | Status: DC

## 2024-03-19 NOTE — Progress Notes (Signed)
 Date:  03/19/2024   Name:  Lawrence Vega   DOB:  Mar 26, 1966   MRN:  [id number]   Chief Complaint: Allergies (Patient began to have sore throat on 03/16/24 he has tightness in his chest and productive cough. He has also had a sneeze and yellow mucus/ drainage.)  Cough This is a chronic problem. The current episode started more than 1 year ago. The problem has been gradually improving. The problem occurs every few hours. The cough is Productive of blood-tinged sputum and productive of purulent sputum. Associated symptoms include nasal congestion, postnasal drip, rhinorrhea, shortness of breath and wheezing. Pertinent negatives include no chest pain, chills, ear congestion, ear pain, fever, headaches, heartburn, hemoptysis, myalgias, rash, sore throat, sweats or weight loss. He has tried a beta-agonist inhaler for the symptoms.    Lab Results  Component Value Date   NA 137 10/18/2023   K 4.8 10/18/2023   CO2 22 10/18/2023   GLUCOSE 90 10/18/2023   BUN 17 10/18/2023   CREATININE 1.07 10/18/2023   CALCIUM 9.6 10/18/2023   EGFR 82 10/18/2023   GFRNONAA 100 08/12/2020   Lab Results  Component Value Date   CHOL 209 (H) 10/18/2023   HDL 45 10/18/2023   LDLCALC 131 (H) 10/18/2023   TRIG 185 (H) 10/18/2023   CHOLHDL 4.6 10/18/2023   Lab Results  Component Value Date   TSH 0.810 09/21/2022   Lab Results  Component Value Date   HGBA1C 5.4 09/21/2022   Lab Results  Component Value Date   WBC 8.0 10/18/2023   HGB 16.9 10/18/2023   HCT 52.8 (H) 10/18/2023   MCV 89 10/18/2023   PLT 250 10/18/2023   Lab Results  Component Value Date   ALT 27 10/18/2023   AST 23 10/18/2023   ALKPHOS 108 10/18/2023   BILITOT 0.8 10/18/2023   No results found for: "25OHVITD2", "25OHVITD3", "VD25OH"   Review of Systems  Constitutional:  Negative for chills, fever and weight loss.  HENT:  Positive for congestion, postnasal drip, rhinorrhea, sinus pressure and sneezing. Negative for ear pain  and sore throat.   Respiratory:  Positive for cough, shortness of breath and wheezing. Negative for hemoptysis and chest tightness.   Cardiovascular:  Negative for chest pain, palpitations and leg swelling.  Gastrointestinal:  Negative for constipation and heartburn.  Musculoskeletal:  Negative for myalgias.  Skin:  Negative for rash.  Neurological:  Negative for headaches.    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   Mixed hyperlipidemia 09/23/2015   Tobacco use disorder, mild, in sustained remission 09/23/2015   Essential hypertension 08/31/2011    No Known Allergies  Past Surgical History:  Procedure Laterality Date   APPENDECTOMY     CATARACT EXTRACTION, BILATERAL  2019   COLONOSCOPY WITH PROPOFOL N/A 10/31/2018   Procedure: COLONOSCOPY WITH BIOPSIES;  Surgeon: Midge Minium, MD;  Location: [facility];  Service: Endoscopy;  Laterality: N/A;   COLONOSCOPY WITH PROPOFOL N/A 01/10/2024   Procedure: COLONOSCOPY WITH PROPOFOL;  Surgeon: Midge Minium, MD;  Location: [facility] END[facility]COPY;  Service: Endoscopy;  Laterality: N/A;   FRACTURE SURGERY Left    left arm and wrist  HEMORRHOID SURGERY     POLYPECTOMY N/A 10/31/2018   Procedure: POLYPECTOMY;  Surgeon: Midge Minium, MD;  Location: [facility];  Service: Endoscopy;  Laterality: N/A;   POLYPECTOMY  01/10/2024   Procedure: POLYPECTOMY;  Surgeon: Midge Minium, MD;  Location: [facility] ENDOSCOPY;  Service: Endoscopy;;    Social History   Tobacco Use   Smoking status: Former    Current packs/day: 0.00    Average packs/day: 2.0 packs/day for 25.0 years (50.0 ttl pk-yrs)    Types:  Cigarettes    Start date: 01/18/1986    Quit date: 01/18/2011    Years since quitting: 13.1   Smokeless tobacco: Never  Vaping Use   Vaping status: Never Used  Substance Use Topics   Alcohol use: No    Alcohol/week: 0.0 standard drinks of alcohol   Drug use: No     Medication list has been reviewed and updated.  Current Meds  Medication Sig   albuterol (VENTOLIN HFA) 108 (90 Base) MCG/ACT inhaler INHALE 2 PUFFS BY MOUTH EVERY 6 HOURS AS NEEDED FOR WHEEZING FOR SHORTNESS OF BREATH   atorvastatin (LIPITOR) 20 MG tablet Take 1 tablet by mouth once daily   fluticasone-salmeterol (ADVAIR) 250-50 MCG/ACT AEPB Inhale 1 puff into the lungs in the morning and at bedtime.   losartan (COZAAR) 50 MG tablet Take 1 tablet by mouth once daily       03/19/2024    2:42 PM 10/18/2023   10:47 AM 09/21/2022    7:58 AM 08/18/2021    8:37 AM  GAD 7 : Generalized Anxiety Score  Nervous, Anxious, on Edge 0 0 0 0  Control/stop worrying 0 0 0 0  Worry too much - different things 0 0 0 0  Trouble relaxing 0 0 0 0  Restless 0 0 0 0  Easily annoyed or irritable 0 0 0 0  Afraid - awful might happen 0 0 0 0  Total GAD 7 Score 0 0 0 0  Anxiety Difficulty Not difficult at all Not difficult at all Not difficult at all        03/19/2024    2:42 PM 10/18/2023   10:47 AM 09/21/2022    7:58 AM  Depression screen PHQ 2/9  Decreased Interest 0 0 0  Down, Depressed, Hopeless 0 0 0  PHQ - 2 Score 0 0 0  Altered sleeping 0 0 0  Tired, decreased energy 0 0 0  Change in appetite 0 0 0  Feeling bad or failure about yourself  0 0 0  Trouble concentrating 0 0 0  Moving slowly or fidgety/restless 0 0 0  Suicidal thoughts 0 0 0  PHQ-9 Score 0 0 0  Difficult doing work/chores Not difficult at all Not difficult at all Not difficult at all    BP Readings from Last 3 Encounters:  03/19/24 116/70  01/10/24 116/86  10/18/23 112/70    Physical Exam Vitals and nursing note reviewed.  Constitutional:      Appearance:  He is well-developed.  HENT:     Head: Normocephalic and atraumatic.     Right Ear: Tympanic membrane, ear canal and external ear normal.     Left Ear: Tympanic membrane, ear canal and external ear normal.     Nose: Nose normal.     Mouth/Throat:     Mouth: Mucous membranes are moist.     Dentition: Normal dentition.     Palate: No mass and lesions.     Pharynx: Oropharynx is clear.  Uvula midline. No oropharyngeal exudate, posterior oropharyngeal erythema or uvula swelling.  Eyes:     General: Lids are normal. No scleral icterus.    Conjunctiva/sclera: Conjunctivae normal.     Pupils: Pupils are equal, round, and reactive to light.  Neck:     Thyroid: No thyromegaly.     Vascular: No carotid bruit, hepatojugular reflux or JVD.     Trachea: No tracheal deviation.  Cardiovascular:     Rate and Rhythm: Normal rate and regular rhythm.     Heart sounds: Normal heart sounds.  Pulmonary:     Effort: Pulmonary effort is normal. Prolonged expiration present.     Breath sounds: Normal breath sounds. No decreased breath sounds, wheezing, rhonchi or rales.  Abdominal:     General: Bowel sounds are normal.     Palpations: Abdomen is soft. There is no hepatomegaly, splenomegaly or mass.     Tenderness: There is no abdominal tenderness.     Hernia: There is no hernia in the left inguinal area.  Musculoskeletal:        General: Normal range of motion.     Cervical back: Normal range of motion and neck supple.  Lymphadenopathy:     Cervical: No cervical adenopathy.  Skin:    General: Skin is warm and dry.     Findings: No rash.  Neurological:     Mental Status: He is alert and oriented to person, place, and time.     Sensory: No sensory deficit.     Deep Tendon Reflexes: Reflexes are normal and symmetric.  Psychiatric:        Mood and Affect: Mood is not anxious or depressed.     Wt Readings from Last 3 Encounters:  03/19/24 200 lb 3.2 oz (90.8 kg)  01/10/24 206 lb (93.4 kg)   10/18/23 209 lb (94.8 kg)    BP 116/70   Pulse 85   Ht 6' (1.829 m)   Wt 200 lb 3.2 oz (90.8 kg)   SpO2 95%   BMI 27.15 kg/m   Assessment and Plan: 1. Acute maxillary sinusitis, recurrence not specified (Primary) Acute.  Persistent.    Relatively stable.  Exam and history consistent with upper respiratory infection that patient has had in the past.  Will treat with Augmentin 875 mg twice a day and will recheck on as-needed basis. - amoxicillin-clavulanate (AUGMENTIN) 875-125 MG tablet; Take 1 tablet by mouth 2 (two) times daily.  Dispense: 20 tablet; Refill: 0 - POCT Influenza A/B - POC COVID-19 BinaxNow  2. Acute exacerbation of COPD with asthma [facility]) Patient currently is on Advair as well as albuterol which she is using infrequently at this time.  I have encouraged him to use the Ventolin at least twice a day along with loading with his Advair long-term beta agonist steroid.  Patient did not not tolerate steroids well in the past and we will hold on these for the time being otherwise patient has been encouraged to use Mucinex DM for mucolytic action as well as mild cough suppression.    Elizabeth Sauer, MD

## 2024-03-30 ENCOUNTER — Other Ambulatory Visit: Payer: Self-pay | Admitting: Pulmonary Disease

## 2024-03-31 ENCOUNTER — Encounter: Payer: Self-pay | Admitting: Urgent Care

## 2024-03-31 ENCOUNTER — Other Ambulatory Visit: Payer: Self-pay

## 2024-03-31 ENCOUNTER — Encounter
Admission: RE | Admit: 2024-03-31 | Discharge: 2024-03-31 | Disposition: A | Discharge status: Home / Self Care | Source: Ambulatory Visit | Attending: Pulmonary Disease | Admitting: Pulmonary Disease

## 2024-03-31 VITALS — Ht 72.0 in | Wt 205.0 lb

## 2024-03-31 DIAGNOSIS — I10 Essential (primary) hypertension: Secondary | ICD-10-CM

## 2024-03-31 DIAGNOSIS — E782 Mixed hyperlipidemia: Secondary | ICD-10-CM

## 2024-03-31 HISTORY — DX: Chronic obstructive pulmonary disease, unspecified: J44.9

## 2024-03-31 HISTORY — DX: Dyspnea, unspecified: R06.00

## 2024-03-31 NOTE — Patient Instructions (Signed)
 Your procedure is scheduled on: Friday 04/03/24 To find out your arrival time, please call [phone] between 1PM - 3PM on:   Thursday 04/02/24 Report to the Registration Desk on the 1st floor of the Medical Mall. Free Valet parking is available.  If your arrival time is 6:00 am, do not arrive before that time as the [facility] entrance doors do not open until 6:00 am.  REMEMBER: Instructions that are not followed completely may result in serious medical risk, up to and including death; or upon the discretion of your surgeon and anesthesiologist your surgery may need to be rescheduled.  Do not eat food or drink any liquids after midnight the night before surgery.  No gum chewing or hard candies.  One week prior to surgery: Stop Anti-inflammatories (NSAIDS) such as Advil, Aleve, Ibuprofen, Motrin, Naproxen, Naprosyn and Aspirin based products such as Excedrin, Goody's Powder, BC Powder. You may however, continue to take Tylenol if needed for pain up until the day of surgery.  Stop ANY OVER THE COUNTER supplements and vitamins until after surgery.  Continue taking all prescribed medications.   TAKE ONLY THESE MEDICATIONS THE MORNING OF SURGERY WITH A SIP OF WATER:  atorvastatin (LIPITOR) 20 MG tablet   Use inhalers on the day of surgery and bring to the hospital.  No Alcohol for 24 hours before or after surgery.  No Smoking including e-cigarettes for 24 hours before surgery.  No chewable tobacco products for at least 6 hours before surgery.  No nicotine patches on the day of surgery.  Do not use any "recreational" drugs for at least a week (preferably 2 weeks) before your surgery.  Please be advised that the combination of cocaine and anesthesia may have negative outcomes, up to and including death. If you test positive for cocaine, your surgery will be cancelled.  On the morning of surgery brush your teeth with toothpaste and water, you may rinse your mouth with mouthwash if  you wish. Do not swallow any toothpaste or mouthwash.  Shower the morning of your procedure.  Do not wear lotions, powders, or perfumes.   Do not shave body hair from the neck down 48 hours before surgery.  Wear comfortable clothing (specific to your surgery type) to the hospital.  Do not wear jewelry, make-up, hairpins, clips or nail polish.  For welded (permanent) jewelry: bracelets, anklets, waist bands, etc.  Please have this removed prior to surgery.  If it is not removed, there is a chance that hospital personnel will need to cut it off on the day of surgery. Contact lenses, hearing aids and dentures may not be worn into surgery.  Do not bring valuables to the hospital. [facility] is not responsible for any missing/lost belongings or valuables.   Notify your doctor if there is any change in your medical condition (cold, fever, infection).  If you are being discharged the day of surgery, you will not be allowed to drive home. You will need a responsible individual to drive you home and stay with you for 24 hours after surgery.   If you are taking public transportation, you will need to have a responsible individual with you.  If you are being admitted to the hospital overnight, leave your suitcase in the car. After surgery it may be brought to your room.  In case of increased patient census, it may be necessary for you, the patient, to continue your postoperative care in the Same Day Surgery department.  After surgery, you can  help prevent lung complications by doing breathing exercises.  Take deep breaths and cough every 1-2 hours. Your doctor may order a device called an Incentive Spirometer to help you take deep breaths. When coughing or sneezing, hold a pillow firmly against your incision with both hands. This is called "splinting." Doing this helps protect your incision. It also decreases belly discomfort.  Surgery Visitation Policy:  Patients undergoing a surgery or  procedure may have two family members or support persons with them as long as the person is not COVID-19 positive or experiencing its symptoms.   Inpatient Visitation:    Visiting hours are 7 a.m. to 8 p.m. Up to four visitors are allowed at one time in a patient room. The visitors may rotate out with other people during the day. One designated support person (adult) may remain overnight.  Please call the Pre-admissions Testing Dept. at [phone] if you have any questions about these instructions.

## 2024-04-01 ENCOUNTER — Other Ambulatory Visit: Payer: Self-pay | Admitting: Pulmonary Disease

## 2024-04-01 DIAGNOSIS — R911 Solitary pulmonary nodule: Secondary | ICD-10-CM

## 2024-04-02 ENCOUNTER — Encounter
Admission: RE | Admit: 2024-04-02 | Discharge: 2024-04-02 | Disposition: A | Discharge status: Home / Self Care | Source: Ambulatory Visit | Attending: Pulmonary Disease | Admitting: Pulmonary Disease

## 2024-04-02 ENCOUNTER — Ambulatory Visit
Admission: RE | Admit: 2024-04-02 | Discharge: 2024-04-02 | Disposition: A | Discharge status: Home / Self Care | Source: Ambulatory Visit | Attending: Pulmonary Disease | Admitting: Pulmonary Disease

## 2024-04-02 ENCOUNTER — Ambulatory Visit
Admission: RE | Admit: 2024-04-02 | Discharge: 2024-04-03 | Disposition: A | Discharge status: Home / Self Care | Source: Ambulatory Visit | Attending: Pulmonary Disease | Admitting: Pulmonary Disease

## 2024-04-02 DIAGNOSIS — I10 Essential (primary) hypertension: Secondary | ICD-10-CM | POA: Insufficient documentation

## 2024-04-02 DIAGNOSIS — J439 Emphysema, unspecified: Secondary | ICD-10-CM | POA: Diagnosis not present

## 2024-04-02 DIAGNOSIS — N400 Enlarged prostate without lower urinary tract symptoms: Secondary | ICD-10-CM | POA: Diagnosis not present

## 2024-04-02 DIAGNOSIS — J449 Chronic obstructive pulmonary disease, unspecified: Secondary | ICD-10-CM | POA: Insufficient documentation

## 2024-04-02 DIAGNOSIS — J984 Other disorders of lung: Secondary | ICD-10-CM | POA: Diagnosis not present

## 2024-04-02 DIAGNOSIS — I700 Atherosclerosis of aorta: Secondary | ICD-10-CM | POA: Insufficient documentation

## 2024-04-02 DIAGNOSIS — E782 Mixed hyperlipidemia: Secondary | ICD-10-CM | POA: Insufficient documentation

## 2024-04-02 DIAGNOSIS — Z01812 Encounter for preprocedural laboratory examination: Secondary | ICD-10-CM

## 2024-04-02 DIAGNOSIS — Z87891 Personal history of nicotine dependence: Secondary | ICD-10-CM | POA: Insufficient documentation

## 2024-04-02 DIAGNOSIS — R911 Solitary pulmonary nodule: Secondary | ICD-10-CM

## 2024-04-02 HISTORY — DX: Hyperlipidemia, unspecified: E78.5

## 2024-04-02 LAB — BASIC METABOLIC PANEL WITH GFR
Anion gap: 8 (ref 5–15)
BUN: 14 mg/dL (ref 6–20)
CO2: 26 mmol/L (ref 22–32)
Calcium: 8.9 mg/dL (ref 8.9–10.3)
Chloride: 103 mmol/L (ref 98–111)
Creatinine, Ser: 0.95 mg/dL (ref 0.61–1.24)
GFR, Estimated: >60 mL/min (ref >60)
Glucose, Bld: 71 mg/dL (ref 70–99)
Potassium: 3.5 mmol/L (ref 3.5–5.1)
Sodium: 137 mmol/L (ref 135–145)

## 2024-04-02 LAB — CBC
HCT: 48.4 % (ref 39.0–52.0)
Hemoglobin: 16.2 g/dL (ref 13.0–17.0)
MCH: 28.6 pg (ref 26.0–34.0)
MCHC: 33.5 g/dL (ref 30.0–36.0)
MCV: 85.4 fL (ref 80.0–100.0)
Platelets: 245 10*3/uL (ref 150–400)
RBC: 5.67 MIL/uL (ref 4.22–5.81)
RDW: 12.3 % (ref 11.5–15.5)
WBC: 7.3 10*3/uL (ref 4.0–10.5)
nRBC: 0 % (ref 0.0–0.2)

## 2024-04-03 ENCOUNTER — Encounter: Payer: Self-pay | Admitting: *Deleted

## 2024-04-03 ENCOUNTER — Encounter
Admission: RE | Disposition: A | Discharge status: Home / Self Care | Payer: Self-pay | Source: Ambulatory Visit | Attending: Pulmonary Disease

## 2024-04-03 ENCOUNTER — Other Ambulatory Visit: Payer: Self-pay

## 2024-04-03 ENCOUNTER — Ambulatory Visit: Admitting: Anesthesiology

## 2024-04-03 ENCOUNTER — Ambulatory Visit

## 2024-04-03 ENCOUNTER — Ambulatory Visit: Admission: RE | Admit: 2024-04-03 | Source: Other Acute Inpatient Hospital

## 2024-04-03 DIAGNOSIS — E782 Mixed hyperlipidemia: Secondary | ICD-10-CM

## 2024-04-03 DIAGNOSIS — I10 Essential (primary) hypertension: Secondary | ICD-10-CM

## 2024-04-03 DIAGNOSIS — J984 Other disorders of lung: Secondary | ICD-10-CM | POA: Diagnosis not present

## 2024-04-03 DIAGNOSIS — Z01812 Encounter for preprocedural laboratory examination: Secondary | ICD-10-CM

## 2024-04-03 HISTORY — PX: VIDEO BRONCHOSCOPY WITH ENDOBRONCHIAL ULTRASOUND: SHX6177

## 2024-04-03 HISTORY — PX: VIDEO BRONCHOSCOPY WITH ENDOBRONCHIAL NAVIGATION: SHX6175

## 2024-04-03 SURGERY — BRONCHOSCOPY, WITH EBUS
Anesthesia: General

## 2024-04-03 MED ORDER — PROPOFOL 10 MG/ML IV BOLUS
INTRAVENOUS | Status: AC
  Filled 2024-04-03: qty 20

## 2024-04-03 MED ORDER — ORAL CARE MOUTH RINSE: 15.0000 mL | Freq: Once | OROMUCOSAL | Status: AC

## 2024-04-03 MED ORDER — ONDANSETRON HCL 4 MG/2ML IJ SOLN
INTRAMUSCULAR | Status: AC
  Filled 2024-04-03: qty 2

## 2024-04-03 MED ORDER — LACTATED RINGERS IV SOLN: INTRAVENOUS | Status: DC

## 2024-04-03 MED ORDER — FENTANYL CITRATE (PF) 100 MCG/2ML IJ SOLN
INTRAMUSCULAR | Status: AC
  Filled 2024-04-03: qty 2

## 2024-04-03 MED ORDER — FENTANYL CITRATE (PF) 100 MCG/2ML IJ SOLN
INTRAMUSCULAR | Status: DC | PRN
  Administered 2024-04-03: 100 ug via INTRAVENOUS

## 2024-04-03 MED ORDER — ROCURONIUM BROMIDE 100 MG/10ML IV SOLN
INTRAVENOUS | Status: DC | PRN
  Administered 2024-04-03: 50 mg via INTRAVENOUS

## 2024-04-03 MED ORDER — ONDANSETRON HCL 4 MG/2ML IJ SOLN: 4.0000 mg | Freq: Once | INTRAMUSCULAR | Status: DC | PRN

## 2024-04-03 MED ORDER — FENTANYL CITRATE (PF) 100 MCG/2ML IJ SOLN: 25.0000 ug | INTRAMUSCULAR | Status: DC | PRN

## 2024-04-03 MED ORDER — LIDOCAINE HCL (PF) 2 % IJ SOLN
INTRAMUSCULAR | Status: AC
  Filled 2024-04-03: qty 5

## 2024-04-03 MED ORDER — CHLORHEXIDINE GLUCONATE 0.12 % MT SOLN
15.0000 mL | Freq: Once | OROMUCOSAL | Status: AC
  Administered 2024-04-03: 15 mL via OROMUCOSAL

## 2024-04-03 MED ORDER — ROCURONIUM BROMIDE 10 MG/ML (PF) SYRINGE
PREFILLED_SYRINGE | INTRAVENOUS | Status: AC
  Filled 2024-04-03: qty 10

## 2024-04-03 MED ORDER — ONDANSETRON HCL 4 MG/2ML IJ SOLN
INTRAMUSCULAR | Status: DC | PRN
  Administered 2024-04-03: 4 mg via INTRAVENOUS

## 2024-04-03 MED ORDER — DEXAMETHASONE SODIUM PHOSPHATE 10 MG/ML IJ SOLN
INTRAMUSCULAR | Status: AC
  Filled 2024-04-03: qty 1

## 2024-04-03 MED ORDER — LIDOCAINE HCL (CARDIAC) PF 100 MG/5ML IV SOSY
PREFILLED_SYRINGE | INTRAVENOUS | Status: DC | PRN
  Administered 2024-04-03: 100 mg via INTRAVENOUS

## 2024-04-03 MED ORDER — PROPOFOL 10 MG/ML IV BOLUS
INTRAVENOUS | Status: DC | PRN
  Administered 2024-04-03: 150 mg via INTRAVENOUS

## 2024-04-03 MED ORDER — MIDAZOLAM HCL 2 MG/2ML IJ SOLN
INTRAMUSCULAR | Status: DC | PRN
  Administered 2024-04-03: 2 mg via INTRAVENOUS

## 2024-04-03 MED ORDER — MIDAZOLAM HCL 2 MG/2ML IJ SOLN
INTRAMUSCULAR | Status: AC
  Filled 2024-04-03: qty 2

## 2024-04-03 MED ORDER — CHLORHEXIDINE GLUCONATE 0.12 % MT SOLN
OROMUCOSAL | Status: AC
  Filled 2024-04-03: qty 15

## 2024-04-03 MED ORDER — DEXMEDETOMIDINE HCL IN NACL 80 MCG/20ML IV SOLN
INTRAVENOUS | Status: AC
  Filled 2024-04-03: qty 20

## 2024-04-03 MED ORDER — SUGAMMADEX SODIUM 200 MG/2ML IV SOLN
INTRAVENOUS | Status: DC | PRN
  Administered 2024-04-03: 200 mg via INTRAVENOUS

## 2024-04-03 MED ORDER — DEXMEDETOMIDINE HCL IN NACL 80 MCG/20ML IV SOLN
INTRAVENOUS | Status: DC | PRN
  Administered 2024-04-03: 8 ug via INTRAVENOUS

## 2024-04-03 MED ORDER — DEXAMETHASONE SODIUM PHOSPHATE 10 MG/ML IJ SOLN
INTRAMUSCULAR | Status: DC | PRN
  Administered 2024-04-03: 10 mg via INTRAVENOUS

## 2024-04-03 NOTE — H&P (Signed)
 PULMONOLOGY         Date: 04/03/2024,   MRN# [id number] GENEVIEVE RITZEL Jan 30, 1966     AdmissionWeight: 93 kg                 CurrentWeight: 93 kg  Referring provider: Anastacio Karvonen, NP   CHIEF COMPLAINT:   Right lower lobe cavitary nodule   HISTORY OF PRESENT ILLNESS   This is a pleasant 58 yo male with a history of BPH, chronic dyspnea, wheezing and COPD and productive cough.  He has suspicious lesion of RLL noted on lung cancer screening program.  The lesion is graded as Lung-RADS 4A suspicous and may be infectious but cannot rule out malignancy.  Patient is here today for evaluation of lungs with airway inspection via bronchoscopy as well as therapeutic aspiration of tracheobronchial tree, BAL and robotic assisted lung biopsies of suspicious lung nodule of Right lower lobe.   Reviewed risks/complications and benefits with patient, risks include infection, pneumothorax/pneumomediastinum which may require chest tube placement as well as overnight/prolonged hospitalization and possible mechanical ventilation. Other risks include bleeding and very rarely death.  Patient understands risks and wishes to proceed.  Additional questions were answered, and patient is aware that post procedure patient will be going home with family and may experience cough with possible clots on expectoration as well as phlegm which may last few days as well as hoarseness of voice post intubation and mechanical ventilation.    PAST MEDICAL HISTORY   Past Medical History:  Diagnosis Date   BPH (benign prostatic hyperplasia)    COPD (chronic obstructive pulmonary disease) (HCC)    Dyspnea    Heartburn    HLD (hyperlipidemia)    Hypertension    Wheezing      SURGICAL HISTORY   Past Surgical History:  Procedure Laterality Date   APPENDECTOMY     CATARACT EXTRACTION, BILATERAL  2019   COLONOSCOPY WITH PROPOFOL  N/A 10/31/2018   Procedure: COLONOSCOPY WITH BIOPSIES;  Surgeon: Marnee Sink, MD;  Location: [facility];  Service: Endoscopy;  Laterality: N/A;   COLONOSCOPY WITH PROPOFOL  N/A 01/10/2024   Procedure: COLONOSCOPY WITH PROPOFOL ;  Surgeon: Marnee Sink, MD;  Location: [facility] END[facility];  Service: Endoscopy;  Laterality: N/A;   FRACTURE SURGERY Left    left arm and wrist   HEMORRHOID SURGERY     POLYPECTOMY N/A 10/31/2018   Procedure: POLYPECTOMY;  Surgeon: Marnee Sink, MD;  Location: [facility];  Service: Endoscopy;  Laterality: N/A;   POLYPECTOMY  01/10/2024   Procedure: POLYPECTOMY;  Surgeon: Marnee Sink, MD;  Location: [facility] ENDOSCOPY;  Service: Endoscopy;;     FAMILY HISTORY   Family History  Problem Relation Age of Onset   Hypertension Father    CAD Father 27   AAA (abdominal aortic aneurysm) Father    Asthma Mother      SOCIAL HISTORY   Social History   Tobacco Use   Smoking status: Former    Current packs/day: 0.00    Average packs/day: 2.0 packs/day for 25.0 years (50.0 ttl pk-yrs)    Types: Cigarettes    Start date: 01/18/1986    Quit date: 01/18/2011    Years since quitting: 13.2   Smokeless tobacco: Never  Vaping Use   Vaping status: Never Used  Substance Use Topics   Alcohol use: No    Alcohol/week: 0.0 standard drinks of alcohol   Drug use: No     MEDICATIONS    Home Medication:  Current Medication:  Current Facility-Administered Medications:    lactated ringers  infusion, , Intravenous, Continuous, Adams, Alston Jerry, MD    ALLERGIES   Patient has no known allergies.     REVIEW OF SYSTEMS    Review of Systems:  Gen:  Denies  fever, sweats, chills weigh loss  HEENT: Denies blurred vision, double vision, ear pain, eye pain, hearing loss, nose bleeds, sore throat Cardiac:  No dizziness, chest pain or heaviness, chest tightness,edema Resp:   reports dyspnea chronically  Gi: Denies swallowing difficulty, stomach pain, nausea or vomiting, diarrhea, constipation, bowel incontinence Gu:  Denies  bladder incontinence, burning urine Ext:   Denies Joint pain, stiffness or swelling Skin: Denies  skin rash, easy bruising or bleeding or hives Endoc:  Denies polyuria, polydipsia , polyphagia or weight change Psych:   Denies depression, insomnia or hallucinations   Other:  All other systems negative   VS: BP (!) 164/105   Pulse 63   Temp (!) 97.2 F (36.2 C) (Temporal)   Resp 18   Ht 6' (1.829 m)   Wt 93 kg   SpO2 98%   BMI 27.80 kg/m      PHYSICAL EXAM    GENERAL:NAD, no fevers, chills, no weakness no fatigue HEAD: Normocephalic, atraumatic.  EYES: Pupils equal, round, reactive to light. Extraocular muscles intact. No scleral icterus.  MOUTH: Moist mucosal membrane. Dentition intact. No abscess noted.  EAR, NOSE, THROAT: Clear without exudates. No external lesions.  NECK: Supple. No thyromegaly. No nodules. No JVD.  PULMONARY: decreased breath sounds with mild rhonchi worse at bases bilaterally.  CARDIOVASCULAR: S1 and S2. Regular rate and rhythm. No murmurs, rubs, or gallops. No edema. Pedal pulses 2+ bilaterally.  GASTROINTESTINAL: Soft, nontender, nondistended. No masses. Positive bowel sounds. No hepatosplenomegaly.  MUSCULOSKELETAL: No swelling, clubbing, or edema. Range of motion full in all extremities.  NEUROLOGIC: Cranial nerves II through XII are intact. No gross focal neurological deficits. Sensation intact. Reflexes intact.  SKIN: No ulceration, lesions, rashes, or cyanosis. Skin warm and dry. Turgor intact.  PSYCHIATRIC: Mood, affect within normal limits. The patient is awake, alert and oriented x 3. Insight, judgment intact.       IMAGING   Narrative & Impression  CLINICAL DATA:  58 year old male former smoker with 44 pack-year smoking history, quit smoking 2012   EXAM: CT CHEST WITHOUT CONTRAST LOW-DOSE FOR LUNG CANCER SCREENING   TECHNIQUE: Multidetector CT imaging of the chest was performed following the standard protocol without IV contrast.    RADIATION DOSE REDUCTION: This exam was performed according to the departmental dose-optimization program which includes automated exposure control, adjustment of the mA and/or kV according to patient size and/or use of iterative reconstruction technique.   COMPARISON:  05/17/2022 chest radiograph.   FINDINGS: Cardiovascular: Normal heart size. No significant pericardial effusion/thickening. Atherosclerotic nonaneurysmal thoracic aorta. Normal caliber pulmonary arteries.   Mediastinum/Nodes: No significant thyroid nodules. Unremarkable esophagus. No pathologically enlarged axillary, mediastinal or hilar lymph nodes, noting limited sensitivity for the detection of hilar adenopathy on this noncontrast study.   Lungs/Pleura: No pneumothorax. No pleural effusion. Moderate paraseptal and centrilobular emphysema with diffuse bronchial wall thickening. Nodular focus of consolidation in the subpleural posteromedial basilar right lower lobe measuring 12.6 mm in volume derived mean diameter (series 3/image 272) with associated mild cylindrical bronchiectasis and mild patchy tree-in-bud opacities in the central right lower lobe. No additional significant pulmonary nodules.   Upper abdomen: Simple 1.2 cm right liver cyst adjacent to the gallbladder fossa.  Musculoskeletal: No aggressive appearing focal osseous lesions. Mild thoracic spondylosis.   IMPRESSION: Lung-RADS 4A, suspicious. Follow up low-dose chest CT without contrast in 3 months (please use the following order, "CT CHEST LCS NODULE FOLLOW-UP W/O CM") is recommended. Nodular 12.6 mm focus of consolidation in the subpleural posteromedial basilar right lower lobe with associated mild cylindrical bronchiectasis and mild patchy tree-in-bud opacities, favoring infectious or inflammatory nodule.   Aortic Atherosclerosis (ICD10-I70.0) and Emphysema (ICD10-J43.9).   These results will be called to the ordering clinician  or representative by the Radiologist Assistant, and communication documented in the PACS or [employer].     Electronically Signed   By: Levell Reach M.D.   On: 03/09/2024 17:35    ASSESSMENT/PLAN   Right lower lobe cavitary lung nodule  - plan for bronchoscopy with airway inspection, therapeutic aspiration of tracheobronchial tree, BAL, lung biopsy with robotic assitance and endobronchial ultrasound to evaluate lymph node stations.   -Reviewed risks/complications and benefits with patient, risks include infection, pneumothorax/pneumomediastinum which may require chest tube placement as well as overnight/prolonged hospitalization and possible mechanical ventilation. Other risks include bleeding and very rarely death.  Patient understands risks and wishes to proceed.  Additional questions were answered, and patient is aware that post procedure patient will be going home with family and may experience cough with possible clots on expectoration as well as phlegm which may last few days as well as hoarseness of voice post intubation and mechanical ventilation.      Thank you for allowing me to participate in the care of this patient.   Patient/Family are satisfied with care plan and all questions have been answered.    Provider disclosure: Patient with at least one acute or chronic illness or injury that poses a threat to life or bodily function and is being managed actively during this encounter.  All of the below services have been performed independently by signing provider:  review of prior documentation from internal and or external health records.  Review of previous and current lab results.  Interview and comprehensive assessment during patient visit today. Review of current and previous chest radiographs/CT scans. Discussion of management and test interpretation with health care team and patient/family.   This document was prepared using Dragon voice recognition software and may  include unintentional dictation errors.     Faustina Gebert, M.D.  Division of Pulmonary & Critical Care Medicine

## 2024-04-03 NOTE — Transfer of Care (Signed)
 Immediate Anesthesia Transfer of Care Note  Patient: Lawrence Vega  Procedure(s) Performed: BRONCHOSCOPY, WITH EBUS VIDEO BRONCHOSCOPY WITH ENDOBRONCHIAL NAVIGATION  Patient Location: PACU  Anesthesia Type:General  Level of Consciousness: drowsy and patient cooperative  Airway & Oxygen Therapy: Patient Spontanous Breathing and Patient connected to nasal cannula oxygen  Post-op Assessment: Report given to RN and Post -op Vital signs reviewed and stable  Post vital signs: Reviewed and stable  Last Vitals:  Vitals Value Taken Time  BP 134/98 04/03/24 1800  Temp 36.1 C 04/03/24 1800  Pulse 74 04/03/24 1810  Resp 15 04/03/24 1810  SpO2 98 % 04/03/24 1810  Vitals shown include unfiled device data.  Last Pain:  Vitals:   04/03/24 1800  TempSrc:   PainSc: 0-No pain         Complications: No notable events documented.

## 2024-04-03 NOTE — Anesthesia Procedure Notes (Signed)
 Procedure Name: Intubation Date/Time: 04/03/2024 4:44 PM  Performed by: Izell Marsh, CRNAPre-anesthesia Checklist: Patient identified, Patient being monitored, Timeout performed, Emergency Drugs available and Suction available Patient Re-evaluated:Patient Re-evaluated prior to induction Oxygen Delivery Method: Circle system utilized Preoxygenation: Pre-oxygenation with 100% oxygen Induction Type: IV induction Ventilation: Mask ventilation without difficulty Laryngoscope Size: 4 and McGrath Grade View: Grade I Tube type: Oral Tube size: 8.0 mm Number of attempts: 1 Airway Equipment and Method: Stylet Placement Confirmation: ETT inserted through vocal cords under direct vision, positive ETCO2 and breath sounds checked- equal and bilateral Secured at: 22 cm Tube secured with: Tape Dental Injury: Teeth and Oropharynx as per pre-operative assessment

## 2024-04-03 NOTE — Anesthesia Preprocedure Evaluation (Signed)
 Anesthesia Evaluation  Patient identified by MRN, date of birth, ID band Patient awake    Reviewed: Allergy & Precautions, NPO status , Patient's Chart, lab work & pertinent test results  Airway Mallampati: II  TM Distance: >3 FB Neck ROM: Full    Dental  (+) Upper Dentures, Lower Dentures   Pulmonary neg pulmonary ROS, shortness of breath, COPD,  COPD inhaler, Patient abstained from smoking., former smoker   Pulmonary exam normal  + decreased breath sounds      Cardiovascular Exercise Tolerance: Good hypertension, Pt. on medications negative cardio ROS Normal cardiovascular exam Rhythm:Regular Rate:Normal     Neuro/Psych negative neurological ROS  negative psych ROS   GI/Hepatic negative GI ROS, Neg liver ROS,,,  Endo/Other  negative endocrine ROS    Renal/GU negative Renal ROS  negative genitourinary   Musculoskeletal   Abdominal Normal abdominal exam  (+)   Peds  Hematology negative hematology ROS (+)   Anesthesia Other Findings Past Medical History: No date: BPH (benign prostatic hyperplasia) No date: COPD (chronic obstructive pulmonary disease) (HCC) No date: Dyspnea No date: Heartburn No date: HLD (hyperlipidemia) No date: Hypertension No date: Wheezing  Past Surgical History: No date: APPENDECTOMY 2019: CATARACT EXTRACTION, BILATERAL 10/31/2018: COLONOSCOPY WITH PROPOFOL ; N/A     Comment:  Procedure: COLONOSCOPY WITH BIOPSIES;  Surgeon: Jinny Carmine, MD;  Location: [facility];  Service:               Endoscopy;  Laterality: N/A; 01/10/2024: COLONOSCOPY WITH PROPOFOL ; N/A     Comment:  Procedure: COLONOSCOPY WITH PROPOFOL ;  Surgeon: Jinny Carmine, MD;  Location: [facility] END[facility]COPY;  Service:               Endoscopy;  Laterality: N/A; No date: FRACTURE SURGERY; Left     Comment:  left arm and wrist No date: HEMORRHOID SURGERY 10/31/2018: POLYPECTOMY; N/A      Comment:  Procedure: POLYPECTOMY;  Surgeon: Jinny Carmine, MD;                Location: [facility];  Service: Endoscopy;                Laterality: N/A; 01/10/2024: POLYPECTOMY     Comment:  Procedure: POLYPECTOMY;  Surgeon: Jinny Carmine, MD;                Location: [facility] ENDOSCOPY;  Service: Endoscopy;;  BMI    Body Mass Index: 27.80 kg/m      Reproductive/Obstetrics negative OB ROS                             Anesthesia Physical Anesthesia Plan  ASA: 2  Anesthesia Plan: General   Post-op Pain Management:    Induction: Intravenous  PONV Risk Score and Plan: Ondansetron , Dexamethasone , Midazolam  and Treatment may vary due to age or medical condition  Airway Management Planned: Oral ETT  Additional Equipment:   Intra-op Plan:   Post-operative Plan: Extubation in OR  Informed Consent: I have reviewed the patients History and Physical, chart, labs and discussed the procedure including the risks, benefits and alternatives for the proposed anesthesia with the patient or authorized representative who has indicated his/her understanding and acceptance.     Dental Advisory Given  Plan Discussed with: CRNA  Anesthesia Plan  Comments:        Anesthesia Quick Evaluation

## 2024-04-03 NOTE — Procedures (Signed)
 ELECTROMAGNETIC NAVIGATIONAL BRONCHOSCOPY PROCEDURE NOTE  FIBEROPTIC BRONCHOSCOPY WITH BRONCHOALVEOLAR LAVAGE PROCEDURE NOTE  ENDOBRONCHIAL ULTRASOUND with =/>1LYMPH NODE BIOPSY PROCEDURE NOTE    Flexible bronchoscopy was performed  by : Jaclynn Mast MD  assistance by : 1)Repiratory therapist  and 2)[employer] staff and 3) Anesthesia team and 4) Flouroscopy team and 5) [employer] supporting staff   Indication for the procedure was :  Pre-procedural H&P. The following assessment was performed on the day of the procedure prior to initiating sedation History:  Chest pain n Dyspnea y Hemoptysis n Cough y Fever n Other pertinent items n  Examination Vital signs -reviewed as per nursing documentation today Cardiac    Murmurs: n  Rubs : n  Gallop: n Lungs Wheezing: n Rales : n Rhonchi :y  Other pertinent findings: SOB/hypoxemia due to chronic lung disease   Pre-procedural assessment for Procedural Sedation included: Depth of sedation: As per anesthesia team  ASA Classification:  2 Mallampati airway assessment: 2    Medication list reviewed: y  The patient's interval history was taken and revealed: no new complaints The pre- procedure physical examination revealed: No new findings Refer to prior clinic note for details.  Informed Consent: Informed consent was obtained from:  patient after explanation of procedure and risks, benefits, as well as alternative procedures available.  Explanation of level of sedation and possible transfusion was also provided.    Procedural Preparation: Time out was performed and patient was identified by name and birthdate and procedure to be performed and side for sampling, if any, was specified. Pt was intubated by anesthesia.  The patient was appropriately draped.   Fiberoptic bronchoscopy with airway inspection and BAL Procedure findings:  Bronchoscope was inserted via ETT  without difficulty.  Posterior oropharynx, epiglottis,  arytenoids, false cords and vocal cords were not visualized as these were bypassed by endotracheal tube. The distal trachea was normal in circumference and appearance without mucosal, cartilaginous or branching abnormalities.  The main carina was mildly splayed . All right and left lobar airways were visualized to the Subsegmental level.  Sub- sub segmental carinae were identified in all the distal airways.   Secretions were visible in the following airways and appeared to be clear.  The mucosa was : friable at RUL  Airways were notable for:        exophytic lesions :n       extrinsic compression in the following distributions: n.       Friable mucosa: y       Teacher, music /pigmentation: n    Therapeutic aspiration performed at RLL, RML, LLL, Lingula.  BAL was done x 2 at RLL  Post procedure Diagnosis:   Bilateral mucus plugging     Robotic Navigational Bronchoscopy Procedure Findings:    Post appropriate planning and registration peripheral navigation was used to visualize target lesion.    Target 1 - Right lower lobe cavitary nodule - cytobrush - endobronchial x 2,  surgical forceps biopsy x 5 endobronchial  Post procedure diagnosis:  - inflammatory [employer] was unsure      Endobronchial ultrasound assisted hilar and mediastinal lymph node biopsies procedure findings: The fiberoptic bronchoscope was removed and the EBUS scope was introduced. Examination began to evaluate for pathologically enlarged lymph nodes starting on the Left side progressing to the Rightside.  All lymph node biopsies performed with 21g needle. Lymph node biopsies were sent in cytolite for all stations.  Station 4L - 5mm - not biopsied Station 10L - 6mm - not biopsied Station  7 - 13mm - biopsied 3 times  Station 10R - 6mm - not biopsied Station 4R- 6mm - not biopsied   Post procedure diagnosis:  Lymphadenopathy of Station 7 lymph node    Specimens obtained included:  Bronchial washings site:  RLL  sent for: Fungal and bacterial cultures                                    Microbiology brushes: RLL ; sent for Fungal and bacterial culutres                      Cytology brushes : RLL   Broncho-alveolar lavage site:RLL   sent for micro and cytology                              20ml volume infused 10ml volume returned with cellular appearance  Endobronchial biopsy site:  RLL ; sent for cytology        Fluoroscopy Used: yes ;        Pictorial documentation attached: n                   Immediate sampling complications included:none  Epinephrine ZERO ml was used topically  The bronchoscopy was terminated due to completion of the planned procedure and the bronchoscope was removed.   Total dosage of Lidocaine  was 3mg  Total fluoroscopy time was AS PER RADIOLOGY  minutes  Supplemental oxygen was provided at AS PER ANESTHESIA lpm by nasal canula post operatively  Estimated Blood loss: <1cc.  Complications included:  NONE IMMEDIATE   Preliminary CXR findings :  IN PROCESS   Disposition: HOME WITH WIFE   Follow up with Dr. Laasya Peyton in 5 days for result discussion.     Erskin Hearing MD  [facility] Division of Pulmonary & Critical Care Medicine

## 2024-04-03 NOTE — Anesthesia Postprocedure Evaluation (Signed)
 Anesthesia Post Note  Patient: Lawrence Vega  Procedure(s) Performed: BRONCHOSCOPY, WITH EBUS VIDEO BRONCHOSCOPY WITH ENDOBRONCHIAL NAVIGATION  Patient location during evaluation: PACU Anesthesia Type: General Level of consciousness: awake and alert Pain management: pain level controlled Vital Signs Assessment: post-procedure vital signs reviewed and stable Respiratory status: spontaneous breathing, nonlabored ventilation, respiratory function stable and patient connected to nasal cannula oxygen Cardiovascular status: blood pressure returned to baseline and stable Postop Assessment: no apparent nausea or vomiting Anesthetic complications: no  No notable events documented.   Last Vitals:  Vitals:   04/03/24 1800 04/03/24 1810  BP: (!) 134/98 132/84  Pulse: 81 75  Resp: 12 16  Temp: (!) 36.1 C 36.7 C  SpO2: 99% 98%    Last Pain:  Vitals:   04/03/24 1810  TempSrc: Temporal  PainSc: 0-No pain                 Enrique Harvest

## 2024-04-06 ENCOUNTER — Encounter: Payer: Self-pay | Admitting: Pulmonary Disease

## 2024-04-06 LAB — CULTURE, BAL-QUANTITATIVE W GRAM STAIN
Culture: NO GROWTH
Gram Stain: NONE SEEN

## 2024-04-06 LAB — CULTURE, RESPIRATORY W GRAM STAIN
Culture: NO GROWTH
Gram Stain: NONE SEEN

## 2024-04-07 LAB — CYTOLOGY - NON PAP

## 2024-04-07 LAB — SURGICAL PATHOLOGY

## 2024-04-08 ENCOUNTER — Ambulatory Visit: Payer: Self-pay | Admitting: Internal Medicine

## 2024-04-09 LAB — ACID FAST SMEAR (AFB, MYCOBACTERIA): Acid Fast Smear: NEGATIVE

## 2024-04-10 ENCOUNTER — Ambulatory Visit: Admitting: Internal Medicine

## 2024-04-24 LAB — CULTURE, FUNGUS WITHOUT SMEAR

## 2024-05-15 ENCOUNTER — Ambulatory Visit

## 2024-05-21 LAB — ACID FAST CULTURE WITH REFLEXED SENSITIVITIES (MYCOBACTERIA): Acid Fast Culture: NEGATIVE

## 2024-07-12 ENCOUNTER — Other Ambulatory Visit: Payer: Self-pay | Admitting: Internal Medicine

## 2024-07-12 DIAGNOSIS — E782 Mixed hyperlipidemia: Secondary | ICD-10-CM

## 2024-07-13 NOTE — Telephone Encounter (Signed)
 Requested Prescriptions  Pending Prescriptions Disp Refills   atorvastatin  (LIPITOR) 20 MG tablet [Pharmacy Med Name: Atorvastatin  Calcium  20 MG Oral Tablet] 90 tablet 0    Sig: Take 1 tablet by mouth once daily     Cardiovascular:  Antilipid - Statins Failed - 07/13/2024  4:26 PM      Failed - Lipid Panel in normal range within the last 12 months    Cholesterol, Total  Date Value Ref Range Status  10/18/2023 209 (H) 100 - 199 mg/dL Final   LDL Chol Calc (NIH)  Date Value Ref Range Status  10/18/2023 131 (H) 0 - 99 mg/dL Final   HDL  Date Value Ref Range Status  10/18/2023 45 >39 mg/dL Final   Triglycerides  Date Value Ref Range Status  10/18/2023 185 (H) 0 - 149 mg/dL Final         Passed - Patient is not pregnant      Passed - Valid encounter within last 12 months    Recent Outpatient Visits           3 months ago Acute maxillary sinusitis, recurrence not specified   [facility] Jones, Deanna C, MD

## 2024-08-03 ENCOUNTER — Ambulatory Visit: Admitting: Internal Medicine

## 2024-08-14 DIAGNOSIS — J449 Chronic obstructive pulmonary disease, unspecified: Secondary | ICD-10-CM | POA: Insufficient documentation

## 2024-08-29 ENCOUNTER — Other Ambulatory Visit: Payer: Self-pay | Admitting: Internal Medicine

## 2024-08-29 DIAGNOSIS — I10 Essential (primary) hypertension: Secondary | ICD-10-CM

## 2024-08-31 NOTE — Telephone Encounter (Signed)
 Requested Prescriptions  Pending Prescriptions Disp Refills   losartan  (COZAAR ) 50 MG tablet [Pharmacy Med Name: Losartan  Potassium 50 MG Oral Tablet] 90 tablet 0    Sig: Take 1 tablet by mouth once daily     Cardiovascular:  Angiotensin Receptor Blockers Passed - 08/31/2024  2:25 PM      Passed - Cr in normal range and within 180 days    Creatinine, Ser  Date Value Ref Range Status  04/02/2024 0.95 0.61 - 1.24 mg/dL Final         Passed - K in normal range and within 180 days    Potassium  Date Value Ref Range Status  04/02/2024 3.5 3.5 - 5.1 mmol/L Final         Passed - Patient is not pregnant      Passed - Last BP in normal range    BP Readings from Last 1 Encounters:  04/03/24 132/84         Passed - Valid encounter within last 6 months    Recent Outpatient Visits           5 months ago Acute maxillary sinusitis, recurrence not specified   [facility] Jones, Deanna C, MD

## 2024-10-10 ENCOUNTER — Other Ambulatory Visit: Payer: Self-pay | Admitting: Internal Medicine

## 2024-10-10 DIAGNOSIS — E782 Mixed hyperlipidemia: Secondary | ICD-10-CM

## 2024-10-12 NOTE — Telephone Encounter (Signed)
 Requested Prescriptions  Pending Prescriptions Disp Refills   atorvastatin  (LIPITOR) 20 MG tablet [Pharmacy Med Name: Atorvastatin  Calcium  20 MG Oral Tablet] 30 tablet 0    Sig: Take 1 tablet by mouth once daily     Cardiovascular:  Antilipid - Statins Failed - 10/12/2024  3:02 PM      Failed - Lipid Panel in normal range within the last 12 months    Cholesterol, Total  Date Value Ref Range Status  10/18/2023 209 (H) 100 - 199 mg/dL Final   LDL Chol Calc (NIH)  Date Value Ref Range Status  10/18/2023 131 (H) 0 - 99 mg/dL Final   HDL  Date Value Ref Range Status  10/18/2023 45 >39 mg/dL Final   Triglycerides  Date Value Ref Range Status  10/18/2023 185 (H) 0 - 149 mg/dL Final         Passed - Patient is not pregnant      Passed - Valid encounter within last 12 months    Recent Outpatient Visits           6 months ago Acute maxillary sinusitis, recurrence not specified   [place] [facility] Cathryne JAYSON, MD

## 2024-10-22 ENCOUNTER — Other Ambulatory Visit: Payer: Self-pay | Admitting: Family

## 2024-10-22 ENCOUNTER — Ambulatory Visit
Admission: RE | Admit: 2024-10-22 | Discharge: 2024-10-22 | Disposition: A | Discharge status: Home / Self Care | Payer: Worker's Compensation | Source: Ambulatory Visit | Attending: Family | Admitting: Family

## 2024-10-22 DIAGNOSIS — M546 Pain in thoracic spine: Secondary | ICD-10-CM | POA: Insufficient documentation

## 2024-10-26 ENCOUNTER — Encounter: Payer: Self-pay | Admitting: Internal Medicine

## 2024-10-26 ENCOUNTER — Ambulatory Visit (INDEPENDENT_AMBULATORY_CARE_PROVIDER_SITE_OTHER): Admitting: Internal Medicine

## 2024-10-26 VITALS — BP 122/76 | HR 94 | Ht 72.0 in | Wt 214.0 lb

## 2024-10-26 DIAGNOSIS — N400 Enlarged prostate without lower urinary tract symptoms: Secondary | ICD-10-CM

## 2024-10-26 DIAGNOSIS — I10 Essential (primary) hypertension: Secondary | ICD-10-CM

## 2024-10-26 DIAGNOSIS — E782 Mixed hyperlipidemia: Secondary | ICD-10-CM | POA: Diagnosis not present

## 2024-10-26 DIAGNOSIS — M5412 Radiculopathy, cervical region: Secondary | ICD-10-CM | POA: Diagnosis not present

## 2024-10-26 DIAGNOSIS — Z0000 Encounter for general adult medical examination without abnormal findings: Secondary | ICD-10-CM | POA: Diagnosis not present

## 2024-10-26 DIAGNOSIS — J432 Centrilobular emphysema: Secondary | ICD-10-CM

## 2024-10-26 MED ORDER — PREDNISONE 10 MG PO TABS: ORAL_TABLET | ORAL | 0 refills | Status: AC

## 2024-10-26 MED ORDER — LOSARTAN POTASSIUM 50 MG PO TABS: 50.0000 mg | ORAL_TABLET | Freq: Every day | ORAL | 0 refills | Status: AC

## 2024-10-26 MED ORDER — ATORVASTATIN CALCIUM 20 MG PO TABS: 20.0000 mg | ORAL_TABLET | Freq: Every day | ORAL | 0 refills | Status: AC

## 2024-10-26 NOTE — Progress Notes (Signed)
 Date:  10/26/2024   Name:  Lawrence Vega   DOB:  17-Dec-1966   MRN:  [id number]   Chief Complaint: Annual Exam and Back Pain (Pt said he fell and hurt his back at work. In a lot of pain. Pt had XR and waiting for MRI with work doctors. Upper mid back between shoulder blades. 10/15/2024 is the day he got hurt.) ADRIEN SHANKAR is a 58 y.o. male who presents today for his Complete Annual Exam. He feels well. He reports exercising. He reports he is sleeping fairly well.   Health Maintenance  Topic Date Due   HIV Screening  Never done   Hepatitis B Vaccine (1 of 3 - 19+ 3-dose series) Never done   Zoster (Shingles) Vaccine (1 of 2) Never done   COVID-19 Vaccine (3 - 2025-26 season) 08/17/2024   Flu Shot  03/16/2025*   Screening for Lung Cancer  04/02/2025   DTaP/Tdap/Td vaccine (2 - Td or Tdap) 09/29/2025   Colon Cancer Screening  01/09/2031   Pneumococcal Vaccine for age over 75  Completed   Hepatitis C Screening  Completed   HPV Vaccine  Aged Out   Meningitis B Vaccine  Aged Out  *Topic was postponed. The date shown is not the original due date.   Lab Results  Component Value Date   PSA1 0.5 10/18/2023   PSA1 0.5 09/21/2022   PSA1 0.5 08/18/2021    Hypertension This is a chronic problem. The problem is controlled. Pertinent negatives include no chest pain, headaches, palpitations or shortness of breath. Past treatments include angiotensin blockers. The current treatment provides significant improvement.  Hyperlipidemia This is a chronic problem. The problem is controlled. Pertinent negatives include no chest pain or shortness of breath. Current antihyperlipidemic treatment includes statins.  Back Pain This is a new problem. Episode onset: 10 days ago at work. The pain is present in the thoracic spine. The quality of the pain is described as burning and cramping. The pain does not radiate. The pain is moderate. Pertinent negatives include no abdominal pain, chest pain,  dysuria, headaches or weakness. He has tried NSAIDs and heat for the symptoms.  COPD - seen by Pulmonary - now on nebulized medications.  Review of Systems  Constitutional:  Negative for fatigue and unexpected weight change.  HENT:  Negative for nosebleeds.   Eyes:  Negative for visual disturbance.  Respiratory:  Negative for cough, chest tightness, shortness of breath and wheezing.   Cardiovascular:  Negative for chest pain, palpitations and leg swelling.  Gastrointestinal:  Negative for abdominal pain, constipation and diarrhea.  Genitourinary:  Negative for dysuria and hematuria.  Musculoskeletal:  Positive for back pain.  Skin:  Negative for rash.  Neurological:  Negative for dizziness, weakness, light-headedness and headaches.  Psychiatric/Behavioral:  Negative for dysphoric mood and sleep disturbance. The patient is not nervous/anxious.      Lab Results  Component Value Date   NA 137 04/02/2024   K 3.5 04/02/2024   CO2 26 04/02/2024   GLUCOSE 71 04/02/2024   BUN 14 04/02/2024   CREATININE 0.95 04/02/2024   CALCIUM  8.9 04/02/2024   EGFR 82 10/18/2023   GFRNONAA >60 04/02/2024   Lab Results  Component Value Date   CHOL 209 (H) 10/18/2023   HDL 45 10/18/2023   LDLCALC 131 (H) 10/18/2023   TRIG 185 (H) 10/18/2023   CHOLHDL 4.6 10/18/2023   Lab Results  Component Value Date   TSH 0.810 09/21/2022   Lab  Results  Component Value Date   HGBA1C 5.4 09/21/2022   Lab Results  Component Value Date   WBC 7.3 04/02/2024   HGB 16.2 04/02/2024   HCT 48.4 04/02/2024   MCV 85.4 04/02/2024   PLT 245 04/02/2024   Lab Results  Component Value Date   ALT 27 10/18/2023   AST 23 10/18/2023   ALKPHOS 108 10/18/2023   BILITOT 0.8 10/18/2023   No results found for: MARIEN BOLLS, VD25OH   Patient Active Problem List   Diagnosis Date Noted   Cervical radiculopathy 10/26/2024   Chronic obstructive pulmonary disease (HCC) 08/14/2024   History of colonic polyps  01/10/2024   Polyp of ascending colon 01/10/2024   Reactive airway disease 09/21/2022   Benign neoplasm of ascending colon    Benign neoplasm of rectosigmoid junction    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   Benign prostatic hyperplasia 09/23/2015   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   Mixed hyperlipidemia 09/23/2015   Tobacco use disorder, mild, in sustained remission 09/23/2015   Essential hypertension 08/31/2011    No Known Allergies  Past Surgical History:  Procedure Laterality Date   APPENDECTOMY     CATARACT EXTRACTION, BILATERAL  2019   COLONOSCOPY WITH PROPOFOL  N/A 10/31/2018   Procedure: COLONOSCOPY WITH BIOPSIES;  Surgeon: Jinny Carmine, MD;  Location: [facility];  Service: Endoscopy;  Laterality: N/A;   COLONOSCOPY WITH PROPOFOL  N/A 01/10/2024   Procedure: COLONOSCOPY WITH PROPOFOL ;  Surgeon: Jinny Carmine, MD;  Location: [facility];  Service: Endoscopy;  Laterality: N/A;   FRACTURE SURGERY Left    left arm and wrist   HEMORRHOID SURGERY     POLYPECTOMY N/A 10/31/2018   Procedure: POLYPECTOMY;  Surgeon: Jinny Carmine, MD;  Location: [facility];  Service: Endoscopy;  Laterality: N/A;   POLYPECTOMY  01/10/2024   Procedure: POLYPECTOMY;  Surgeon: Jinny Carmine, MD;  Location: [facility];  Service: Endoscopy;;   VIDEO BRONCHOSCOPY WITH ENDOBRONCHIAL NAVIGATION N/A 04/03/2024   Procedure: VIDEO BRONCHOSCOPY WITH ENDOBRONCHIAL NAVIGATION;  Surgeon: Parris Manna, MD;  Location: [facility];  Service: Thoracic;  Laterality: N/A;   VIDEO BRONCHOSCOPY WITH ENDOBRONCHIAL ULTRASOUND N/A 04/03/2024   Procedure: BRONCHOSCOPY, WITH EBUS;  Surgeon: Parris Manna, MD;  Location: [facility];  Service: Thoracic;  Laterality: N/A;    Social History   Tobacco Use   Smoking status: Former    Current packs/day: 0.00    Average packs/day: 2.0 packs/day for 25.0 years (50.0 ttl pk-yrs)    Types: Cigarettes    Start date: 01/18/1986    Quit date: 01/18/2011     Years since quitting: 13.7   Smokeless tobacco: Never  Vaping Use   Vaping status: Never Used  Substance Use Topics   Alcohol use: No    Alcohol/week: 0.0 standard drinks of alcohol   Drug use: No     Medication list has been reviewed and updated.  Current Meds  Medication Sig   albuterol  (VENTOLIN  HFA) 108 (90 Base) MCG/ACT inhaler INHALE 2 PUFFS BY MOUTH EVERY 6 HOURS AS NEEDED FOR WHEEZING FOR SHORTNESS OF BREATH   celecoxib (CELEBREX) 200 MG capsule Take 200 mg by mouth daily.   cetirizine (ZYRTEC) 10 MG tablet Take 10 mg by mouth daily.   Ipratropium-Albuterol  (COMBIVENT) 20-100 MCG/ACT AERS respimat Inhale 2 puffs into the lungs every 6 (six) hours as needed.   predniSONE  (DELTASONE ) 10 MG tablet Take 6 tablets (60 mg total) by mouth daily with breakfast for 1 day, THEN 5 tablets (50 mg total)  daily with breakfast for 1 day, THEN 4 tablets (40 mg total) daily with breakfast for 1 day, THEN 3 tablets (30 mg total) daily with breakfast for 1 day, THEN 2 tablets (20 mg total) daily with breakfast for 1 day, THEN 1 tablet (10 mg total) daily with breakfast for 1 day.   [DISCONTINUED] atorvastatin  (LIPITOR) 20 MG tablet Take 1 tablet by mouth once daily   [DISCONTINUED] BREYNA 160-4.5 MCG/ACT inhaler Inhale 2 puffs into the lungs 2 (two) times daily.   [DISCONTINUED] budesonide (PULMICORT) 0.5 MG/2ML nebulizer solution Take 0.5 mg by nebulization daily.   [DISCONTINUED] losartan  (COZAAR ) 50 MG tablet Take 1 tablet by mouth once daily       10/26/2024    8:42 AM 03/19/2024    2:42 PM 10/18/2023   10:47 AM 09/21/2022    7:58 AM  GAD 7 : Generalized Anxiety Score  Nervous, Anxious, on Edge 0 0 0 0  Control/stop worrying 0 0 0 0  Worry too much - different things 0 0 0 0  Trouble relaxing 0 0 0 0  Restless 0 0 0 0  Easily annoyed or irritable 0 0 0 0  Afraid - awful might happen 0 0 0 0  Total GAD 7 Score 0 0 0 0  Anxiety Difficulty Not difficult at all Not difficult at all Not  difficult at all Not difficult at all       10/26/2024    8:42 AM 03/19/2024    2:42 PM 10/18/2023   10:47 AM  Depression screen PHQ 2/9  Decreased Interest 0 0 0  Down, Depressed, Hopeless 0 0 0  PHQ - 2 Score 0 0 0  Altered sleeping 0 0 0  Tired, decreased energy 0 0 0  Change in appetite 0 0 0  Feeling bad or failure about yourself  0 0 0  Trouble concentrating 0 0 0  Moving slowly or fidgety/restless 0 0 0  Suicidal thoughts 0 0 0  PHQ-9 Score 0 0  0   Difficult doing work/chores Not difficult at all Not difficult at all Not difficult at all     Data saved with a previous flowsheet row definition    BP Readings from Last 3 Encounters:  10/26/24 122/76  04/03/24 132/84  03/19/24 116/70    Physical Exam Vitals and nursing note reviewed.  Constitutional:      Appearance: Normal appearance. He is well-developed.  HENT:     Head: Normocephalic.     Right Ear: Tympanic membrane, ear canal and external ear normal.     Left Ear: Tympanic membrane, ear canal and external ear normal.     Nose: Nose normal.  Eyes:     Conjunctiva/sclera: Conjunctivae normal.     Pupils: Pupils are equal, round, and reactive to light.  Neck:     Thyroid: No thyromegaly.     Vascular: No carotid bruit.  Cardiovascular:     Rate and Rhythm: Normal rate and regular rhythm.     Heart sounds: Normal heart sounds.  Pulmonary:     Effort: Pulmonary effort is normal.     Breath sounds: Normal breath sounds. No wheezing.  Chest:  Breasts:    Right: No mass.     Left: No mass.  Abdominal:     General: Bowel sounds are normal.     Palpations: Abdomen is soft.     Tenderness: There is no abdominal tenderness.  Musculoskeletal:        General:  Normal range of motion.     Cervical back: Normal range of motion and neck supple.     Right lower leg: No edema.     Left lower leg: No edema.  Lymphadenopathy:     Cervical: No cervical adenopathy.  Skin:    General: Skin is warm and dry.      Capillary Refill: Capillary refill takes less than 2 seconds.  Neurological:     General: No focal deficit present.     Mental Status: He is alert and oriented to person, place, and time.     Sensory: Sensation is intact.     Motor: Motor function is intact.     Deep Tendon Reflexes:     Reflex Scores:      Bicep reflexes are 1+ on the right side and 1+ on the left side.      Patellar reflexes are 1+ on the right side and 1+ on the left side. Psychiatric:        Attention and Perception: Attention normal.        Mood and Affect: Mood normal.        Thought Content: Thought content normal.     Wt Readings from Last 3 Encounters:  10/26/24 214 lb (97.1 kg)  04/03/24 205 lb (93 kg)  03/31/24 205 lb (93 kg)    BP 122/76   Pulse 94   Ht 6' (1.829 m)   Wt 214 lb (97.1 kg)   SpO2 98%   BMI 29.02 kg/m   Assessment and Plan:  Problem List Items Addressed This Visit       Unprioritized   Benign prostatic hyperplasia (Chronic)   Relevant Orders   PSA   Mixed hyperlipidemia (Chronic)   LDL is  Lab Results  Component Value Date   LDLCALC 131 (H) 10/18/2023    Current medication regimen is atorvastatin . Goal LDL is < 100.       Relevant Medications   atorvastatin  (LIPITOR) 20 MG tablet   losartan  (COZAAR ) 50 MG tablet   Other Relevant Orders   Lipid panel   Essential hypertension (Chronic)   Well controlled blood pressure today. Current regimen is losartan . No medication side effects noted.        Relevant Medications   atorvastatin  (LIPITOR) 20 MG tablet   losartan  (COZAAR ) 50 MG tablet   Other Relevant Orders   CBC with Differential/Platelet   Comprehensive metabolic panel with GFR   Urinalysis, Routine w reflex microscopic   TSH   Chronic obstructive pulmonary disease (HCC)   Seen by Pulmonary for a suspicious nodule - bronchoscopy done with bx - benign but with infection and necrotic debris Symptoms  much improved after antibiotics Now on Breyna daily  with prn combivent      Relevant Medications   Ipratropium-Albuterol  (COMBIVENT) 20-100 MCG/ACT AERS respimat   cetirizine (ZYRTEC) 10 MG tablet   predniSONE  (DELTASONE ) 10 MG tablet   Cervical radiculopathy   Recent work injury with pain in the thoracic and lower cervical spine with tingling into both arms. No weakness and moderate pain Taking Advil only, used some heat but has not yet seen Ortho - WC is ordering an MRI Will add steroid taper      Relevant Medications   predniSONE  (DELTASONE ) 10 MG tablet   Other Visit Diagnoses       Annual physical exam    -  Primary   up to date on screenings and immunizations will get Flu through work  He plans to change PCP to [facility]. No follow-ups on file.    Leita HILARIO Adie, MD [facility] [place]

## 2024-10-26 NOTE — Assessment & Plan Note (Signed)
 LDL is  Lab Results  Component Value Date   LDLCALC 131 (H) 10/18/2023    Current medication regimen is atorvastatin . Goal LDL is < 100.

## 2024-10-26 NOTE — Assessment & Plan Note (Signed)
 Seen by Pulmonary for a suspicious nodule - bronchoscopy done with bx - benign but with infection and necrotic debris Symptoms  much improved after antibiotics Now on Breyna daily with prn combivent

## 2024-10-26 NOTE — Assessment & Plan Note (Signed)
 Recent work injury with pain in the thoracic and lower cervical spine with tingling into both arms. No weakness and moderate pain Taking Advil only, used some heat but has not yet seen Ortho - WC is ordering an MRI Will add steroid taper

## 2024-10-26 NOTE — Assessment & Plan Note (Signed)
 Well controlled blood pressure today. Current regimen is losartan . No medication side effects noted.

## 2024-10-27 ENCOUNTER — Ambulatory Visit: Payer: Self-pay | Admitting: Internal Medicine

## 2024-10-27 LAB — CBC WITH DIFFERENTIAL/PLATELET
Basophils Absolute: 0.1 x10E3/uL (ref 0.0–0.2)
Basos: 1 %
EOS (ABSOLUTE): 0.2 x10E3/uL (ref 0.0–0.4)
Eos: 3 %
Hematocrit: 47.9 % (ref 37.5–51.0)
Hemoglobin: 16.1 g/dL (ref 13.0–17.7)
Immature Grans (Abs): 0.1 x10E3/uL (ref 0.0–0.1)
Immature Granulocytes: 1 %
Lymphocytes Absolute: 1.5 x10E3/uL (ref 0.7–3.1)
Lymphs: 20 %
MCH: 29.6 pg (ref 26.6–33.0)
MCHC: 33.6 g/dL (ref 31.5–35.7)
MCV: 88 fL (ref 79–97)
Monocytes Absolute: 0.6 x10E3/uL (ref 0.1–0.9)
Monocytes: 8 %
Neutrophils Absolute: 5.2 x10E3/uL (ref 1.4–7.0)
Neutrophils: 67 %
Platelets: 221 x10E3/uL (ref 150–450)
RBC: 5.44 x10E6/uL (ref 4.14–5.80)
RDW: 13 % (ref 11.6–15.4)
WBC: 7.7 x10E3/uL (ref 3.4–10.8)

## 2024-10-27 LAB — LIPID PANEL
Chol/HDL Ratio: 3 ratio (ref 0.0–5.0)
Cholesterol, Total: 169 mg/dL (ref 100–199)
HDL: 56 mg/dL (ref >39)
LDL Chol Calc (NIH): 92 mg/dL (ref 0–99)
Triglycerides: 115 mg/dL (ref 0–149)
VLDL Cholesterol Cal: 21 mg/dL (ref 5–40)

## 2024-10-27 LAB — COMPREHENSIVE METABOLIC PANEL WITH GFR
ALT: 22 IU/L (ref 0–44)
AST: 23 IU/L (ref 0–40)
Albumin: 4.8 g/dL (ref 3.8–4.9)
Alkaline Phosphatase: 94 IU/L (ref 47–123)
BUN/Creatinine Ratio: 11 (ref 9–20)
BUN: 10 mg/dL (ref 6–24)
Bilirubin Total: 0.5 mg/dL (ref 0.0–1.2)
CO2: 24 mmol/L (ref 20–29)
Calcium: 9.4 mg/dL (ref 8.7–10.2)
Chloride: 101 mmol/L (ref 96–106)
Creatinine, Ser: 0.95 mg/dL (ref 0.76–1.27)
Globulin, Total: 2 g/dL (ref 1.5–4.5)
Glucose: 92 mg/dL (ref 70–99)
Potassium: 4.3 mmol/L (ref 3.5–5.2)
Sodium: 140 mmol/L (ref 134–144)
Total Protein: 6.8 g/dL (ref 6.0–8.5)
eGFR: 94 mL/min/1.73 (ref >59)

## 2024-10-27 LAB — URINALYSIS, ROUTINE W REFLEX MICROSCOPIC
Bilirubin, UA: NEGATIVE
Glucose, UA: NEGATIVE
Ketones, UA: NEGATIVE
Leukocytes,UA: NEGATIVE
Nitrite, UA: NEGATIVE
Protein,UA: NEGATIVE
RBC, UA: NEGATIVE
Specific Gravity, UA: 1.02 (ref 1.005–1.030)
Urobilinogen, Ur: 0.2 mg/dL (ref 0.2–1.0)
pH, UA: 6 (ref 5.0–7.5)

## 2024-10-27 LAB — TSH: TSH: 0.796 u[IU]/mL (ref 0.450–4.500)

## 2024-10-27 LAB — PSA: Prostate Specific Ag, Serum: 0.6 ng/mL (ref 0.0–4.0)

## 2024-11-06 ENCOUNTER — Other Ambulatory Visit: Payer: Self-pay | Admitting: Orthopedic Surgery

## 2024-11-06 DIAGNOSIS — M542 Cervicalgia: Secondary | ICD-10-CM

## 2024-11-18 ENCOUNTER — Ambulatory Visit
Admission: RE | Admit: 2024-11-18 | Discharge: 2024-11-18 | Disposition: A | Payer: Worker's Compensation | Source: Ambulatory Visit | Attending: Orthopedic Surgery

## 2024-11-18 DIAGNOSIS — M542 Cervicalgia: Secondary | ICD-10-CM | POA: Diagnosis present

## 2024-11-28 ENCOUNTER — Other Ambulatory Visit: Payer: Self-pay | Admitting: Internal Medicine

## 2024-11-28 DIAGNOSIS — E782 Mixed hyperlipidemia: Secondary | ICD-10-CM

## 2024-12-01 NOTE — Telephone Encounter (Signed)
 No longer current dosing of this medication  Requested Prescriptions  Pending Prescriptions Disp Refills   atorvastatin  (LIPITOR) 10 MG tablet [Pharmacy Med Name: Atorvastatin  Calcium  10 MG Oral Tablet] 90 tablet 0    Sig: Take 1 tablet by mouth once daily     Cardiovascular:  Antilipid - Statins Failed - 12/01/2024 10:56 AM      Failed - Lipid Panel in normal range within the last 12 months    Cholesterol, Total  Date Value Ref Range Status  10/26/2024 169 100 - 199 mg/dL Final   LDL Chol Calc (NIH)  Date Value Ref Range Status  10/26/2024 92 0 - 99 mg/dL Final   HDL  Date Value Ref Range Status  10/26/2024 56 >39 mg/dL Final   Triglycerides  Date Value Ref Range Status  10/26/2024 115 0 - 149 mg/dL Final         Passed - Patient is not pregnant      Passed - Valid encounter within last 12 months    Recent Outpatient Visits           1 month ago Annual physical exam   [facility], Leita DEL, MD   8 months ago Acute maxillary sinusitis, recurrence not specified   [facility] [place], Deanna C, MD

## 2025-04-26 ENCOUNTER — Ambulatory Visit: Admitting: Family Medicine
